# Patient Record
Sex: Female | Born: 1950 | Race: Black or African American | Hispanic: No | Smoking: Never smoker
Health system: Southern US, Community
[De-identification: ages and names within clinical notes are randomized; demographics above are authoritative.]

## PROBLEM LIST (undated history)

## (undated) DIAGNOSIS — E079 Disorder of thyroid, unspecified: Secondary | ICD-10-CM

## (undated) DIAGNOSIS — E785 Hyperlipidemia, unspecified: Secondary | ICD-10-CM

## (undated) DIAGNOSIS — I8390 Asymptomatic varicose veins of unspecified lower extremity: Secondary | ICD-10-CM

## (undated) DIAGNOSIS — D72819 Decreased white blood cell count, unspecified: Principal | ICD-10-CM

## (undated) DIAGNOSIS — I10 Essential (primary) hypertension: Secondary | ICD-10-CM

## (undated) HISTORY — DX: Asymptomatic varicose veins of unspecified lower extremity: I83.90

## (undated) HISTORY — DX: Decreased white blood cell count, unspecified: D72.819

## (undated) HISTORY — PX: JOINT REPLACEMENT: SHX530

## (undated) HISTORY — PX: ROTATOR CUFF REPAIR: SHX139

---

## 1998-05-18 ENCOUNTER — Other Ambulatory Visit: Admission: RE | Admit: 1998-05-18 | Discharge: 1998-05-18 | Payer: Self-pay | Admitting: Internal Medicine

## 1998-06-29 ENCOUNTER — Ambulatory Visit (HOSPITAL_COMMUNITY): Admission: RE | Admit: 1998-06-29 | Discharge: 1998-06-29 | Payer: Self-pay | Admitting: Internal Medicine

## 1998-06-29 ENCOUNTER — Encounter: Payer: Self-pay | Admitting: Internal Medicine

## 1999-07-10 ENCOUNTER — Other Ambulatory Visit: Admission: RE | Admit: 1999-07-10 | Discharge: 1999-07-10 | Payer: Self-pay | Admitting: Internal Medicine

## 1999-08-14 ENCOUNTER — Emergency Department (HOSPITAL_COMMUNITY): Admission: EM | Admit: 1999-08-14 | Discharge: 1999-08-14 | Payer: Self-pay

## 2000-06-03 ENCOUNTER — Other Ambulatory Visit: Admission: RE | Admit: 2000-06-03 | Discharge: 2000-06-03 | Payer: Self-pay | Admitting: Internal Medicine

## 2001-06-24 ENCOUNTER — Other Ambulatory Visit: Admission: RE | Admit: 2001-06-24 | Discharge: 2001-06-24 | Payer: Self-pay | Admitting: Internal Medicine

## 2001-10-20 ENCOUNTER — Ambulatory Visit (HOSPITAL_COMMUNITY): Admission: RE | Admit: 2001-10-20 | Discharge: 2001-10-20 | Payer: Self-pay | Admitting: Internal Medicine

## 2001-10-20 ENCOUNTER — Encounter: Payer: Self-pay | Admitting: Internal Medicine

## 2001-12-10 ENCOUNTER — Encounter: Payer: Self-pay | Admitting: Emergency Medicine

## 2001-12-10 ENCOUNTER — Emergency Department (HOSPITAL_COMMUNITY): Admission: EM | Admit: 2001-12-10 | Discharge: 2001-12-10 | Payer: Self-pay | Admitting: Emergency Medicine

## 2002-01-22 ENCOUNTER — Encounter: Payer: Self-pay | Admitting: Endocrinology

## 2002-01-22 ENCOUNTER — Ambulatory Visit (HOSPITAL_COMMUNITY): Admission: RE | Admit: 2002-01-22 | Discharge: 2002-01-22 | Payer: Self-pay | Admitting: Endocrinology

## 2002-02-17 ENCOUNTER — Ambulatory Visit (HOSPITAL_COMMUNITY): Admission: RE | Admit: 2002-02-17 | Discharge: 2002-02-17 | Payer: Self-pay | Admitting: Endocrinology

## 2002-02-17 ENCOUNTER — Encounter: Payer: Self-pay | Admitting: Endocrinology

## 2002-10-22 ENCOUNTER — Encounter: Payer: Self-pay | Admitting: Obstetrics & Gynecology

## 2002-10-22 ENCOUNTER — Ambulatory Visit (HOSPITAL_COMMUNITY): Admission: RE | Admit: 2002-10-22 | Discharge: 2002-10-22 | Payer: Self-pay | Admitting: Obstetrics & Gynecology

## 2003-11-10 ENCOUNTER — Ambulatory Visit (HOSPITAL_COMMUNITY): Admission: RE | Admit: 2003-11-10 | Discharge: 2003-11-10 | Payer: Self-pay | Admitting: Internal Medicine

## 2003-12-17 ENCOUNTER — Emergency Department (HOSPITAL_COMMUNITY): Admission: EM | Admit: 2003-12-17 | Discharge: 2003-12-17 | Payer: Self-pay | Admitting: Emergency Medicine

## 2004-01-11 ENCOUNTER — Encounter: Admission: RE | Admit: 2004-01-11 | Discharge: 2004-02-04 | Payer: Self-pay | Admitting: Internal Medicine

## 2004-04-17 ENCOUNTER — Ambulatory Visit (HOSPITAL_COMMUNITY): Admission: RE | Admit: 2004-04-17 | Discharge: 2004-04-17 | Payer: Self-pay | Admitting: Gastroenterology

## 2004-09-29 ENCOUNTER — Emergency Department (HOSPITAL_COMMUNITY): Admission: EM | Admit: 2004-09-29 | Discharge: 2004-09-29 | Payer: Self-pay | Admitting: Emergency Medicine

## 2004-10-05 ENCOUNTER — Encounter: Admission: RE | Admit: 2004-10-05 | Discharge: 2004-10-05 | Payer: Self-pay | Admitting: Internal Medicine

## 2005-04-25 ENCOUNTER — Emergency Department (HOSPITAL_COMMUNITY): Admission: EM | Admit: 2005-04-25 | Discharge: 2005-04-25 | Payer: Self-pay | Admitting: Emergency Medicine

## 2006-04-25 ENCOUNTER — Ambulatory Visit (HOSPITAL_COMMUNITY): Admission: RE | Admit: 2006-04-25 | Discharge: 2006-04-25 | Payer: Self-pay | Admitting: Gastroenterology

## 2006-05-02 ENCOUNTER — Encounter: Admission: RE | Admit: 2006-05-02 | Discharge: 2006-05-02 | Payer: Self-pay | Admitting: Gastroenterology

## 2007-08-07 ENCOUNTER — Emergency Department (HOSPITAL_COMMUNITY): Admission: EM | Admit: 2007-08-07 | Discharge: 2007-08-07 | Payer: Self-pay | Admitting: Emergency Medicine

## 2008-02-11 ENCOUNTER — Encounter: Admission: RE | Admit: 2008-02-11 | Discharge: 2008-02-11 | Payer: Self-pay | Admitting: Internal Medicine

## 2008-04-02 ENCOUNTER — Emergency Department (HOSPITAL_COMMUNITY): Admission: EM | Admit: 2008-04-02 | Discharge: 2008-04-03 | Payer: Self-pay | Admitting: Emergency Medicine

## 2008-11-01 ENCOUNTER — Ambulatory Visit: Payer: Self-pay | Admitting: Internal Medicine

## 2009-01-05 ENCOUNTER — Emergency Department (HOSPITAL_COMMUNITY): Admission: EM | Admit: 2009-01-05 | Discharge: 2009-01-05 | Payer: Self-pay | Admitting: Emergency Medicine

## 2009-01-25 ENCOUNTER — Ambulatory Visit (HOSPITAL_COMMUNITY): Admission: RE | Admit: 2009-01-25 | Discharge: 2009-01-25 | Payer: Self-pay | Admitting: Cardiology

## 2009-06-07 ENCOUNTER — Encounter: Admission: RE | Admit: 2009-06-07 | Discharge: 2009-06-07 | Payer: Self-pay | Admitting: Family Medicine

## 2010-03-10 ENCOUNTER — Encounter: Admission: RE | Admit: 2010-03-10 | Discharge: 2010-03-10 | Payer: Self-pay | Admitting: Family Medicine

## 2010-05-07 ENCOUNTER — Emergency Department (HOSPITAL_COMMUNITY)
Admission: EM | Admit: 2010-05-07 | Discharge: 2010-05-07 | Payer: Self-pay | Source: Home / Self Care | Admitting: Emergency Medicine

## 2010-06-11 ENCOUNTER — Encounter: Payer: Self-pay | Admitting: Internal Medicine

## 2010-07-31 LAB — COMPREHENSIVE METABOLIC PANEL
AST: 25 U/L (ref 0–37)
Albumin: 3.7 g/dL (ref 3.5–5.2)
Alkaline Phosphatase: 78 U/L (ref 39–117)
BUN: 16 mg/dL (ref 6–23)
Calcium: 9.3 mg/dL (ref 8.4–10.5)
Creatinine, Ser: 1.19 mg/dL (ref 0.4–1.2)
GFR calc Af Amer: 57 mL/min — ABNORMAL LOW (ref >60)
GFR calc non Af Amer: 47 mL/min — ABNORMAL LOW (ref >60)
Glucose, Bld: 96 mg/dL (ref 70–99)
Potassium: 3.7 mEq/L (ref 3.5–5.1)
Total Bilirubin: 0.4 mg/dL (ref 0.3–1.2)
Total Protein: 7.5 g/dL (ref 6.0–8.3)

## 2010-07-31 LAB — CBC
HCT: 38.2 % (ref 36.0–46.0)
Hemoglobin: 12 g/dL (ref 12.0–15.0)
MCH: 27.8 pg (ref 26.0–34.0)
MCHC: 31.4 g/dL (ref 30.0–36.0)
MCV: 88.6 fL (ref 78.0–100.0)
RBC: 4.31 MIL/uL (ref 3.87–5.11)
WBC: 4.8 10*3/uL (ref 4.0–10.5)

## 2010-07-31 LAB — DIFFERENTIAL
Eosinophils Absolute: 0 10*3/uL (ref 0.0–0.7)
Lymphocytes Relative: 40 % (ref 12–46)
Monocytes Relative: 6 % (ref 3–12)
Neutro Abs: 2.6 10*3/uL (ref 1.7–7.7)

## 2010-07-31 LAB — URINALYSIS, ROUTINE W REFLEX MICROSCOPIC
Hgb urine dipstick: NEGATIVE
pH: 5.5 (ref 5.0–8.0)

## 2010-08-26 LAB — CBC
HCT: 33.5 % — ABNORMAL LOW (ref 36.0–46.0)
Hemoglobin: 11.1 g/dL — ABNORMAL LOW (ref 12.0–15.0)
Platelets: 223 10*3/uL (ref 150–400)
RBC: 4.04 MIL/uL (ref 3.87–5.11)
WBC: 4.6 10*3/uL (ref 4.0–10.5)

## 2010-08-26 LAB — POCT I-STAT, CHEM 8
Chloride: 106 mEq/L (ref 96–112)
Glucose, Bld: 91 mg/dL (ref 70–99)
Hemoglobin: 11.6 g/dL — ABNORMAL LOW (ref 12.0–15.0)
Sodium: 140 mEq/L (ref 135–145)
TCO2: 25 mmol/L (ref 0–100)

## 2010-08-26 LAB — DIFFERENTIAL
Basophils Relative: 0 % (ref 0–1)
Eosinophils Absolute: 0 10*3/uL (ref 0.0–0.7)
Eosinophils Relative: 1 % (ref 0–5)
Lymphocytes Relative: 37 % (ref 12–46)
Lymphs Abs: 1.7 10*3/uL (ref 0.7–4.0)
Monocytes Absolute: 0.4 10*3/uL (ref 0.1–1.0)
Neutrophils Relative %: 54 % (ref 43–77)

## 2010-08-26 LAB — POCT CARDIAC MARKERS
CKMB, poc: <1 ng/mL — ABNORMAL LOW (ref 1.0–8.0)
Myoglobin, poc: 80.5 ng/mL (ref 12–200)
Troponin i, poc: <0.05 ng/mL (ref 0.00–0.09)

## 2010-10-06 NOTE — Op Note (Signed)
Beth Potts, Beth Potts              ACCOUNT NO.:  [id number]   MEDICAL RECORD NO.:  [id number]          PATIENT TYPE:  AMB   LOCATION:  ENDO                         FACILITY:  [facility]   PHYSICIAN:  Anselmo Rod, M.D.  DATE OF BIRTH:  10-26-1950   DATE OF PROCEDURE:  04/17/2004  DATE OF DISCHARGE:                                 OPERATIVE REPORT   PROCEDURE PERFORMED:  Screening colonoscopy.   ENDOSCOPIST:  Anselmo Rod, M.D.   INSTRUMENT USED:  Olympus video colonoscope.   INDICATION FOR PROCEDURE:  A 60 year old African-American female undergoing  screening colonoscopy to rule out colonic polyps, masses, etc.   PREPROCEDURE PREPARATION:  Informed consent was procured from the patient.  The patient was fasted for eight hours prior to the procedure and prepped  with a bottle of magnesium citrate and a gallon of GoLYTELY the night prior  to the procedure.  A 10% miss rate of polyps and cancers was discussed with  the patient as well in addition to other risks and benefits of the  procedure.   PREPROCEDURE PHYSICAL:  VITAL SIGNS:  The patient had stable vital signs.  NECK:  Supple.  CHEST:  Clear to auscultation.  S1, S2 regular.  ABDOMEN:  Soft with normal bowel sounds.   DESCRIPTION OF PROCEDURE:  The patient was placed in the left lateral  decubitus position and sedated with 60 mg of Demerol and 7 mg of Versed in  slow incremental doses.  Once the patient was adequately sedate and  maintained on low-flow oxygen and continuous cardiac monitoring, the Olympus  video colonoscope was advanced from the rectum to the cecum.  The  appendiceal orifice and the ileocecal valve were visualized and  photographed.  There was a large amount of residual stool in the colon,  especially on the right side.  Multiple washes were done.  The terminal  ileum appeared healthy and without lesions.  No masses, polyps, erosions,  ulcerations, or diverticula were seen.  Retroflexion in the rectum  revealed  no abnormalities.  As there was a large amount of residual stool in the  colon, small lesions could have been missed.  The patient tolerated the  procedure well without complications.   IMPRESSION:  1.  Inadequate prep with a large amount of residual stool in the colon.  No      masses, polyps, or diverticula seen.  2.  Normal terminal ileum.   RECOMMENDATIONS:  1.  A repeat colonoscopy has been recommended in the next five years or      earlier if the patient develops any abnormal symptoms in the interim.  2.  Continue a high-fiber diet with liberal fluid intake.  3.  Repeat guaiac testing should be done on a yearly basis as prep was      inadequate this time.  4.  Outpatient follow-up as need arises in the future.       JNM/MEDQ  D:  04/17/2004  T:  04/17/2004  Job:  [id number]   cc:   Merlene Laughter. Renae Gloss, M.D.  [address]  [address]  [place]  [place] [address]  Fax: [phone]

## 2012-08-21 ENCOUNTER — Other Ambulatory Visit: Payer: Self-pay

## 2012-08-21 DIAGNOSIS — Z1231 Encounter for screening mammogram for malignant neoplasm of breast: Secondary | ICD-10-CM

## 2012-08-22 ENCOUNTER — Other Ambulatory Visit: Payer: Self-pay | Admitting: Internal Medicine

## 2012-08-22 ENCOUNTER — Ambulatory Visit
Admission: RE | Admit: 2012-08-22 | Discharge: 2012-08-22 | Disposition: A | Discharge status: Home / Self Care | Source: Ambulatory Visit | Attending: Internal Medicine | Admitting: Internal Medicine

## 2012-08-22 DIAGNOSIS — Z111 Encounter for screening for respiratory tuberculosis: Secondary | ICD-10-CM

## 2012-09-23 ENCOUNTER — Ambulatory Visit
Admission: RE | Admit: 2012-09-23 | Discharge: 2012-09-23 | Disposition: A | Discharge status: Home / Self Care | Source: Ambulatory Visit

## 2012-09-23 DIAGNOSIS — Z1231 Encounter for screening mammogram for malignant neoplasm of breast: Secondary | ICD-10-CM

## 2013-01-12 ENCOUNTER — Emergency Department (HOSPITAL_COMMUNITY)
Admission: EM | Admit: 2013-01-12 | Discharge: 2013-01-12 | Disposition: A | Discharge status: Home / Self Care | Attending: Emergency Medicine | Admitting: Emergency Medicine

## 2013-01-12 ENCOUNTER — Encounter (HOSPITAL_COMMUNITY): Payer: Self-pay | Admitting: Emergency Medicine

## 2013-01-12 DIAGNOSIS — Z862 Personal history of diseases of the blood and blood-forming organs and certain disorders involving the immune mechanism: Secondary | ICD-10-CM | POA: Insufficient documentation

## 2013-01-12 DIAGNOSIS — L539 Erythematous condition, unspecified: Secondary | ICD-10-CM

## 2013-01-12 DIAGNOSIS — R21 Rash and other nonspecific skin eruption: Secondary | ICD-10-CM | POA: Insufficient documentation

## 2013-01-12 DIAGNOSIS — M7989 Other specified soft tissue disorders: Secondary | ICD-10-CM | POA: Insufficient documentation

## 2013-01-12 DIAGNOSIS — L02419 Cutaneous abscess of limb, unspecified: Secondary | ICD-10-CM | POA: Insufficient documentation

## 2013-01-12 DIAGNOSIS — L03116 Cellulitis of left lower limb: Secondary | ICD-10-CM

## 2013-01-12 DIAGNOSIS — Z8639 Personal history of other endocrine, nutritional and metabolic disease: Secondary | ICD-10-CM | POA: Insufficient documentation

## 2013-01-12 DIAGNOSIS — I10 Essential (primary) hypertension: Secondary | ICD-10-CM | POA: Insufficient documentation

## 2013-01-12 HISTORY — DX: Essential (primary) hypertension: I10

## 2013-01-12 HISTORY — DX: Disorder of thyroid, unspecified: E07.9

## 2013-01-12 HISTORY — DX: Hyperlipidemia, unspecified: E78.5

## 2013-01-12 MED ORDER — SULFAMETHOXAZOLE-TRIMETHOPRIM 800-160 MG PO TABS: 1.0000 | ORAL_TABLET | Freq: Two times a day (BID) | ORAL | Status: DC

## 2013-01-12 MED ORDER — HYDROXYZINE HCL 25 MG PO TABS: 25.0000 mg | ORAL_TABLET | Freq: Four times a day (QID) | ORAL | Status: DC

## 2013-01-12 NOTE — ED Notes (Signed)
Pt has a rash on her L ankle that has been there x 3 weeks. Saw her PA and was given penicillin, hydrocodone and a steroid cream but she states it is still hurting and itching. Also notes numbness in both her feet.

## 2013-01-12 NOTE — ED Provider Notes (Signed)
CSN: [id number]     Arrival date & time 01/12/13  1655 History  This chart was scribed for Junius Finner, PA working with Dagmar Hait, MD by Quintella Reichert, ED Scribe. This patient was seen in room WTR9/WTR9 and the patient's care was started at 5:36 PM.    Chief Complaint  Patient presents with  . Rash  . Numbness    The history is provided by the patient. No language interpreter was used.    HPI Comments: TAQUISHA PHUNG is a 62 y.o. female with h/o HTN, hyperlipidemia and hyperthyroidism who presents to the Emergency Department complaining of a progressively-worsening, painful, red, itchy rash on her left ankle that began 3 weeks ago with associated swelling to her left lower leg.  Pain is described as intermittent burning and throbbing at a severity of 8/10.  Pt also notes numbness to both of her feet.  She has attempted to treat symptoms with OTC hydrocortisone cream, without relief.  More recently she was prescribed penicillin and mometasone cream by her PA, which she has been using for the past 4 days also without relief.  Pt denies any recent long travel.  She denies allergies to her knowledge.  She states she has h/o pre-diabetes but has never been diagnosed with diabetes.  She denies h/o heart problems to her knowledge.  CBG on admission is 78.  She medicates with Synthroid and states that her levels have been normal.     Past Medical History  Diagnosis Date  . Thyroid disease   . Hypertension   . Hyperlipidemia      Past Surgical History  Procedure Laterality Date  . Rotator cuff repair Bilateral   . Joint replacement Right     knee     No family history on file.   History  Substance Use Topics  . Smoking status: Never Smoker   . Smokeless tobacco: Not on file  . Alcohol Use: No    OB History   Grav Para Term Preterm Abortions TAB SAB Ect Mult Living                   Review of Systems  Musculoskeletal:       Lower left leg swelling  Skin:  Positive for rash.  All other systems reviewed and are negative.      Allergies  Review of patient's allergies indicates no known allergies.  Home Medications   Current Outpatient Rx  Name  Route  Sig  Dispense  Refill  . hydrOXYzine (ATARAX/VISTARIL) 25 MG tablet   Oral   Take 1 tablet (25 mg total) by mouth every 6 (six) hours.   12 tablet   0   . sulfamethoxazole-trimethoprim (SEPTRA DS) 800-160 MG per tablet   Oral   Take 1 tablet by mouth every 12 (twelve) hours.   20 tablet   0     Pulse 70  Temp(Src) 98.7 F (37.1 C) (Oral)  SpO2 100%  Physical Exam  Nursing note and vitals reviewed. Constitutional: She is oriented to person, place, and time. She appears well-developed and well-nourished.  HENT:  Head: Normocephalic and atraumatic.  Eyes: EOM are normal.  Neck: Normal range of motion.  Cardiovascular: Normal rate.   Pulmonary/Chest: Effort normal.  Musculoskeletal: Normal range of motion.       Left lower leg: She exhibits edema.  Mild-to-moderate edema to left lower leg.  Neurological: She is alert and oriented to person, place, and time.  Skin: Skin  is warm and dry. Rash noted. There is erythema.  Left lower leg: Erythema with excoriation.  No warmth to palpation.  No pustular abscess.   Prominent varicose veins, greater to left lower leg than right.  Psychiatric: She has a normal mood and affect. Her behavior is normal.    ED Course  Procedures (including critical care time)  DIAGNOSTIC STUDIES: Oxygen Saturation is 100% on room air, normal by my interpretation.    COORDINATION OF CARE: 5:43 PM-Discussed treatment plan which includes evaluation by attending physician with pt at bedside and pt agreed to plan.    Labs Review Labs Reviewed  GLUCOSE, CAPILLARY    Imaging Review No results found.   MDM   1. Cellulitis of left lower leg    Initial concern for DVT as pt had more prominent varicose veins with swelling and pain in left lower  leg, however pruritic rash not typical for DVT.  Venous duplex: negative for DVT or bakers cyst.  Discussed pt with Dr. Gwendolyn Grant who also examined pt.  Will tx for cellulitis with bactrim as pt was initially placed on PCN, which is not first line for skin infections.  Pt also given atarax help with severe pruritis.  Advised she may continue to use hydrocortisone cream for relief as well.  Return precautions provided. Pt verbalized understanding and agreement with tx plan.   I personally performed the services described in this documentation, which was scribed in my presence. The recorded information has been reviewed and is accurate.    Junius Finner, PA-C 01/13/13 1751

## 2013-01-12 NOTE — Progress Notes (Signed)
VASCULAR LAB PRELIMINARY  PRELIMINARY  PRELIMINARY  PRELIMINARY  Left lower extremity venous duplex completed.    Preliminary report:  Left:  No evidence of DVT, superficial thrombosis, or Baker's cyst.  Evangelene Vora, RVT 01/12/2013, 7:23 PM

## 2013-01-12 NOTE — ED Notes (Signed)
MD at bedside. 

## 2013-01-12 NOTE — ED Notes (Signed)
CBG 78. 

## 2013-01-15 NOTE — ED Provider Notes (Signed)
Medical screening examination/treatment/procedure(s) were conducted as a shared visit with non-physician practitioner(s) and myself.  I personally evaluated the patient during the encounter  Patient with 3 weeks of L leg redness, itching. PCP put on Penicillin. Mild swelling of L leg, NVI distally. Mild patchy erythema. [place] negative, will treat with bactrim and give Atarax for itching.   Dagmar Hait, MD 01/15/13 [phone]

## 2013-06-03 ENCOUNTER — Other Ambulatory Visit: Payer: Self-pay | Admitting: Family

## 2013-06-03 ENCOUNTER — Ambulatory Visit
Admission: RE | Admit: 2013-06-03 | Discharge: 2013-06-03 | Disposition: A | Discharge status: Home / Self Care | Source: Ambulatory Visit | Attending: Family | Admitting: Family

## 2013-06-03 DIAGNOSIS — S99929A Unspecified injury of unspecified foot, initial encounter: Secondary | ICD-10-CM

## 2013-06-03 DIAGNOSIS — R200 Anesthesia of skin: Secondary | ICD-10-CM

## 2013-07-22 ENCOUNTER — Ambulatory Visit (INDEPENDENT_AMBULATORY_CARE_PROVIDER_SITE_OTHER)

## 2013-07-22 ENCOUNTER — Encounter: Payer: Self-pay | Admitting: Podiatrist

## 2013-07-22 ENCOUNTER — Ambulatory Visit (INDEPENDENT_AMBULATORY_CARE_PROVIDER_SITE_OTHER): Admitting: Podiatrist

## 2013-07-22 ENCOUNTER — Ambulatory Visit: Payer: Self-pay | Admitting: Podiatrist

## 2013-07-22 VITALS — BP 131/77 | HR 68 | Resp 16 | Ht 67.0 in | Wt 212.0 lb

## 2013-07-22 DIAGNOSIS — L600 Ingrowing nail: Secondary | ICD-10-CM

## 2013-07-22 DIAGNOSIS — M7150 Other bursitis, not elsewhere classified, unspecified site: Secondary | ICD-10-CM

## 2013-07-22 DIAGNOSIS — M216X9 Other acquired deformities of unspecified foot: Secondary | ICD-10-CM

## 2013-07-22 DIAGNOSIS — M79673 Pain in unspecified foot: Secondary | ICD-10-CM

## 2013-07-22 DIAGNOSIS — M79609 Pain in unspecified limb: Secondary | ICD-10-CM

## 2013-07-22 MED ORDER — CEPHALEXIN 500 MG PO CAPS: 500.0000 mg | ORAL_CAPSULE | Freq: Three times a day (TID) | ORAL | Status: DC

## 2013-07-22 NOTE — Progress Notes (Deleted)
bilat ingrowns both borders 1st;  Injection 3,4 is left  Debride callus submet 3/4 left

## 2013-07-22 NOTE — Progress Notes (Signed)
   Subjective:    Patient ID: Beth Potts, female    DOB: 1950-07-12, 63 y.o.   MRN: [id number]  HPI Comments: "I have ingrown toenails and something is going on with the left foot"  Patient c/o painful 1st toes bilateral, both borders, for several months. She usually gets a pedicure and has them trim out. She has been trimming herself but still gets sore. Pressure against is uncomfortable.  Patient also c/o throbbing and numbness plantar forefoot left for about 3 months. The are is callused and sometimes numbness is in her toes. She remembers banging her foot at night. She did see her PCP at that time, they xrayed and did not see fracture. She has been getting the calluses trimmed during pedicures and soaking her feet with no relief. Worsening, especially with walking.  Foot Pain Associated symptoms include numbness.      Review of Systems  Neurological: Positive for numbness.  Hematological: Bruises/bleeds easily.  All other systems reviewed and are negative.       Objective:   Physical Exam GENERAL APPEARANCE: Alert, conversant. Appropriately groomed. No acute distress.  VASCULAR: Pedal pulses palpable at 2/4 DP and PT bilateral.  Capillary refill time is immediate to all digits,  Proximal to distal cooling it warm to warm.  Digital hair growth is present bilateral  NEUROLOGIC: sensation is intact epicritically and protectively to 5.07 monofilament at 5/5 sites bilateral.  Light touch is intact bilateral, vibratory sensation intact bilateral, achilles tendon reflex is intact bilateral.  MUSCULOSKELETAL: Prominent, plantarflexed metatarsals 3 and 4 of the left foot are noted. Pain at the third and fourth interspace is also present. Pes planus foot type is noted.  DERMATOLOGIC: Ingrown toenails of bilateral borders of bilateral great toes noted. No redness, no signs of infection are present. She also has a hyperkeratotic lesion submetatarsal 3/4 of the left foot which is tender  and symptomatic. Intact integument is present sub-lesionally, no sign of ulceration or skin breakdown.   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  Recommended permanent phenol matrixectomy and patient agreed.  bilateral great toe was prepped with alcohol and a 1 to 1 mix of 0.5% marcaine plain and 2% lidocaine plain was administered in a digital block fashion.  The toe was then prepped with betadine solution and exsanguinated.  The offending nail border was then excised and matrix tissue exposed.  Phenol was then applied to the matrix tissue followed by an alcohol wash.  Antibiotic ointment and a dry sterile dressing was applied.  The patient was dispensed instructions for aftercare.  I divided the hyperkeratotic lesion submetatarsal 3/4 without complication. I injected third and fourth interspace with Kenalog and Marcaine mixture as well under sterile technique. I will see her back for recheck. 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

## 2013-07-22 NOTE — Patient Instructions (Signed)

## 2013-07-23 MED ORDER — TRIAMCINOLONE ACETONIDE 10 MG/ML IJ SUSP
10.0000 mg | Freq: Once | INTRAMUSCULAR | Status: AC
  Administered 2013-07-22: 10 mg

## 2013-07-29 ENCOUNTER — Telehealth: Payer: Self-pay | Admitting: *Deleted

## 2013-07-29 NOTE — Telephone Encounter (Signed)
Pt states toe is still numb from procedure last week and what can she soak it in to decrease the tenderness.  I informed pt, the numbness would gradually decrease, and she could soak in 1/4 C epsom salt and 1 qt warm water 2 time daily, then cover with an antibiotic ointment bandaid for 4 - 6 weeks or until the area got a dry hard scab.  I told the pt she could allow the area to air dry when at home resting.

## 2013-08-05 ENCOUNTER — Ambulatory Visit: Admitting: Podiatrist

## 2013-10-28 ENCOUNTER — Encounter: Payer: Self-pay | Admitting: Podiatrist

## 2013-10-28 ENCOUNTER — Ambulatory Visit (INDEPENDENT_AMBULATORY_CARE_PROVIDER_SITE_OTHER): Admitting: Podiatrist

## 2013-10-28 VITALS — BP 153/79 | HR 70 | Resp 12

## 2013-10-28 DIAGNOSIS — D219 Benign neoplasm of connective and other soft tissue, unspecified: Secondary | ICD-10-CM

## 2013-10-28 DIAGNOSIS — D3610 Benign neoplasm of peripheral nerves and autonomic nervous system, unspecified: Secondary | ICD-10-CM

## 2013-10-28 DIAGNOSIS — M216X9 Other acquired deformities of unspecified foot: Secondary | ICD-10-CM

## 2013-10-28 DIAGNOSIS — Q828 Other specified congenital malformations of skin: Secondary | ICD-10-CM

## 2013-10-28 NOTE — Patient Instructions (Signed)
[facility]  The back doctors-- we will call you with the date and phone number for your appointment.  Call if you have any problems with the referral

## 2013-10-29 NOTE — Progress Notes (Signed)
Patient presents for left foot pain follow up- she relates the injection administered last visit was not helpful and she still has pain in the forefoot region of the left foot.  She describes the pain as shooting and it radiates from her hip down to her foot.  She states she has some numbness between the 3rd and 4th toes as well.  She also has a hard lesion submet 3 which is painful as well.  Objective:  Patient is awake, alert, and oriented x 3.  In no acute distress.  Vascular status is intact with palpable pedal pulses at 2/4 DP and PT bilateral and capillary refill time within normal limits. Neurological sensation is also intact bilaterally via Semmes Weinstein monofilament at 5/5 sites. Light touch, vibratory sensation, Achilles tendon reflex is intact. Dermatological exam reveals skin color, turger and texture as normal. No open lesions present.  Musculature intact with dorsiflexion, plantarflexion, inversion, eversion.    Hyperkeratotic lesion submet 3 of the left foot is present.  Core with waxy appearance is present.  Pain at the 3rd interspace and numbness is noted.  Difficult to tell if a neuroma is present versus the lesion, versus generalized nerve discomfort.  Assessment:  Neuroma vs nerve impingement/sciatica,  Prominent metatarsal with porokeratotic lesion  Plan:  Debridement of lesion carried out today.  Recommended injection vs. Referral to a neurosurgeon to see if she is having nerve related back issues contributing to her foot pain.  She would like to have the referral before proceeding with another injection.  I will see her back in 1 month for follow up and for possible alcohol sclerosing therapy for a possible neuroma left foot.

## 2013-11-13 ENCOUNTER — Telehealth: Payer: Self-pay | Admitting: *Deleted

## 2013-11-13 NOTE — Telephone Encounter (Signed)
Referral was made to [facility] for possible Disc Injury, Back pain extending to foot left.   [facility] [address]  [place], [place] [address] Ph: [phone]

## 2013-11-25 ENCOUNTER — Ambulatory Visit (INDEPENDENT_AMBULATORY_CARE_PROVIDER_SITE_OTHER): Admitting: Podiatrist

## 2013-11-25 ENCOUNTER — Encounter: Payer: Self-pay | Admitting: Podiatrist

## 2013-11-25 VITALS — BP 123/69 | HR 73 | Resp 18

## 2013-11-25 DIAGNOSIS — D219 Benign neoplasm of connective and other soft tissue, unspecified: Secondary | ICD-10-CM

## 2013-11-25 DIAGNOSIS — S90222A Contusion of left lesser toe(s) with damage to nail, initial encounter: Secondary | ICD-10-CM

## 2013-11-25 DIAGNOSIS — D3610 Benign neoplasm of peripheral nerves and autonomic nervous system, unspecified: Secondary | ICD-10-CM

## 2013-11-25 DIAGNOSIS — M722 Plantar fascial fibromatosis: Secondary | ICD-10-CM

## 2013-11-25 DIAGNOSIS — S90129A Contusion of unspecified lesser toe(s) without damage to nail, initial encounter: Secondary | ICD-10-CM

## 2013-11-25 MED ORDER — MELOXICAM 15 MG PO TABS: 15.0000 mg | ORAL_TABLET | Freq: Every day | ORAL | Status: AC

## 2013-11-25 NOTE — Progress Notes (Signed)
I HAVE SOME NUMBNESS IN MY TOES ON MY LEFT AND THE BIG TOENAIL THAT SHE DID ON MY LEFT BIG TOENAIL IS ABOUT TO COME OFF AND THE HEEL HURTS SOME  Subjective: Patient presents today for continued numbness in her toes and slight loosening of the left great toenail. She also states she has heel pain in the left heel. She has an appointment to see a neurosurgeon for her back as I am concerned that it may be a back issue causing her foot pain. We are waiting to do an injection until we real that out.  Objective: Pain on palpation plantar medial aspect of the left heel consistent with plantar fasciitis is noted. Consistent pain at the metatarsal region is noted as well. Left hallux nail is slightly loose but intact she has a wedding to go to [place] she wants to leave that alone  Assessment: Plantar fasciitis, probable neuroma, neuritis left foot, loose hallux nail  Plan: Recommended injection and the patient would like to hold off prescribed MOBIC anti-inflammatory, dispense a plantar fascial brace, recommended stretching exercises. We will followup regarding the numbness in her foot after she sees her neurosurgeon

## 2013-11-25 NOTE — Patient Instructions (Signed)

## 2014-04-23 ENCOUNTER — Ambulatory Visit: Admitting: Podiatrist

## 2014-04-28 ENCOUNTER — Ambulatory Visit (INDEPENDENT_AMBULATORY_CARE_PROVIDER_SITE_OTHER): Admitting: Podiatrist

## 2014-04-28 ENCOUNTER — Encounter: Payer: Self-pay | Admitting: Podiatrist

## 2014-04-28 VITALS — BP 117/70 | HR 70 | Resp 12

## 2014-04-28 DIAGNOSIS — G629 Polyneuropathy, unspecified: Secondary | ICD-10-CM

## 2014-04-28 DIAGNOSIS — Q828 Other specified congenital malformations of skin: Secondary | ICD-10-CM

## 2014-04-28 MED ORDER — AMITRIPTYLINE HCL 25 MG PO TABS: 25.0000 mg | ORAL_TABLET | Freq: Every day | ORAL | Status: AC

## 2014-04-28 MED ORDER — SALICYLIC ACID 10 % EX CREA: 1.0000 "application " | TOPICAL_CREAM | Freq: Every day | CUTANEOUS | Status: DC

## 2014-04-28 NOTE — Patient Instructions (Signed)
Corns and Calluses Corns are small areas of thickened skin that usually occur on the top, sides, or tip of a toe. They contain a cone-shaped core with a point that can press on a nerve below. This causes pain. Calluses are areas of thickened skin that usually develop on hands, fingers, palms, soles of the feet, and heels. These are areas that experience frequent friction or pressure. CAUSES  Corns are usually the result of rubbing (friction) or pressure from shoes that are too tight or do not fit properly. Calluses are caused by repeated friction and pressure on the affected areas. SYMPTOMS  A hard growth on the skin.  Pain or tenderness under the skin.  Sometimes, redness and swelling.  Increased discomfort while wearing tight-fitting shoes. DIAGNOSIS  Your caregiver can usually tell what the problem is by doing a physical exam. TREATMENT  Removing the cause of the friction or pressure is usually the only treatment needed. However, sometimes medicines can be used to help soften the hardened, thickened areas. These medicines include salicylic acid plasters and 12% ammonium lactate lotion. These medicines should only be used under the direction of your caregiver. HOME CARE INSTRUCTIONS   Try to remove pressure from the affected area.  You may wear donut-shaped corn pads to protect your skin.  You may use a pumice stone or nonmetallic nail file to gently reduce the thickness of a corn.  Wear properly fitted footwear.  If you have calluses on the hands, wear gloves during activities that cause friction.  If you have diabetes, you should regularly examine your feet. Tell your caregiver if you notice any problems with your feet. SEEK IMMEDIATE MEDICAL CARE IF:   You have increased pain, swelling, redness, or warmth in the affected area.  Your corn or callus starts to drain fluid or bleeds.  You are not getting better, even with treatment. Document Released: 02/11/2004 Document  Revised: 07/30/2011 Document Reviewed: 01/02/2011 Exit[employer] Patient Information 2015 [employer]. This information is not intended to replace advice given to you by your health care provider. Make sure you discuss any questions you have with your health care provider.  

## 2014-04-28 NOTE — Progress Notes (Signed)
  Subjective: Patient presents today for continued numbness in her toes and pain in the forefoot region of bilateral feet.  She has a skin lesion that is painful and symptomatic.  She saw a  neurosurgeon for her back and he stated her foot pain is unrelated to back issues.   Objective: Pain on palpation plantar forefoot bilateral.  A well circumscribed lesion is present submet 3 bilateral which is painful with direct pressure and debridement.  Heel pain is not bothersome at today's visit. neuropathic symptomatology is subjectively related. Sensation is intact via Semmes-Weinstein monofilament at 4 out of 5 sites.  Assessment: Prominent metatarsal heads, porokeratotic lesion, neuropathy   Plan: Debridement of the porokeratotic lesion is accomplished today without complication. Recommended amitriptyline at night and 80% salicylic acid cream for the lesions. She'll be seen back for recheck and follow-up of the amitriptyline.Marland KitchenMarland Kitchen

## 2014-05-12 ENCOUNTER — Other Ambulatory Visit: Payer: Self-pay | Admitting: *Deleted

## 2014-05-12 MED ORDER — SALICYLIC ACID 10 % EX CREA: 1.0000 "application " | TOPICAL_CREAM | Freq: Every day | CUTANEOUS | Status: DC

## 2014-05-12 NOTE — Telephone Encounter (Signed)
[facility] sent over a request to change Salicylic Acid from 22% to 6%, don't have it in stock.  Dr. Marta Antu the change.

## 2014-06-02 ENCOUNTER — Other Ambulatory Visit: Payer: Self-pay | Admitting: *Deleted

## 2014-06-02 NOTE — Telephone Encounter (Signed)
[facility] sent over a request asking if prescription could be changed to 6% instead of 10%.  Dr. Cala Bradford the change.

## 2014-10-15 NOTE — Telephone Encounter (Signed)
PT HAS AN APPOINTMENT ON 12/10/2014 AT 4:45 WITH DR. HARKINS

## 2014-10-29 ENCOUNTER — Other Ambulatory Visit: Payer: Self-pay

## 2014-10-29 DIAGNOSIS — Z1231 Encounter for screening mammogram for malignant neoplasm of breast: Secondary | ICD-10-CM

## 2014-11-03 ENCOUNTER — Ambulatory Visit
Admission: RE | Admit: 2014-11-03 | Discharge: 2014-11-03 | Disposition: A | Discharge status: Home / Self Care | Source: Ambulatory Visit

## 2014-11-03 DIAGNOSIS — Z1231 Encounter for screening mammogram for malignant neoplasm of breast: Secondary | ICD-10-CM

## 2014-11-14 ENCOUNTER — Telehealth: Payer: Self-pay | Admitting: *Deleted

## 2014-12-09 ENCOUNTER — Emergency Department (HOSPITAL_COMMUNITY)
Admission: EM | Admit: 2014-12-09 | Discharge: 2014-12-09 | Disposition: A | Discharge status: Home / Self Care | Attending: Emergency Medicine | Admitting: Emergency Medicine

## 2014-12-09 ENCOUNTER — Encounter (HOSPITAL_COMMUNITY): Payer: Self-pay | Admitting: Emergency Medicine

## 2014-12-09 DIAGNOSIS — E079 Disorder of thyroid, unspecified: Secondary | ICD-10-CM | POA: Insufficient documentation

## 2014-12-09 DIAGNOSIS — R519 Headache, unspecified: Secondary | ICD-10-CM

## 2014-12-09 DIAGNOSIS — E785 Hyperlipidemia, unspecified: Secondary | ICD-10-CM | POA: Diagnosis not present

## 2014-12-09 DIAGNOSIS — Z791 Long term (current) use of non-steroidal anti-inflammatories (NSAID): Secondary | ICD-10-CM | POA: Insufficient documentation

## 2014-12-09 DIAGNOSIS — M791 Myalgia, unspecified site: Secondary | ICD-10-CM

## 2014-12-09 DIAGNOSIS — R5383 Other fatigue: Secondary | ICD-10-CM | POA: Diagnosis not present

## 2014-12-09 DIAGNOSIS — R252 Cramp and spasm: Secondary | ICD-10-CM | POA: Insufficient documentation

## 2014-12-09 DIAGNOSIS — R51 Headache: Secondary | ICD-10-CM | POA: Diagnosis not present

## 2014-12-09 DIAGNOSIS — Z79899 Other long term (current) drug therapy: Secondary | ICD-10-CM | POA: Insufficient documentation

## 2014-12-09 DIAGNOSIS — Z792 Long term (current) use of antibiotics: Secondary | ICD-10-CM | POA: Diagnosis not present

## 2014-12-09 DIAGNOSIS — I10 Essential (primary) hypertension: Secondary | ICD-10-CM | POA: Diagnosis not present

## 2014-12-09 LAB — CBC WITH DIFFERENTIAL/PLATELET
BASOS ABS: 0 10*3/uL (ref 0.0–0.1)
Basophils Relative: 0 % (ref 0–1)
EOS ABS: 0 10*3/uL (ref 0.0–0.7)
Eosinophils Relative: 1 % (ref 0–5)
HEMATOCRIT: 40.1 % (ref 36.0–46.0)
Hemoglobin: 12.5 g/dL (ref 12.0–15.0)
LYMPHS ABS: 2 10*3/uL (ref 0.7–4.0)
LYMPHS PCT: 44 % (ref 12–46)
MCH: 26.3 pg (ref 26.0–34.0)
MCHC: 31.2 g/dL (ref 30.0–36.0)
MCV: 84.4 fL (ref 78.0–100.0)
MONO ABS: 0.3 10*3/uL (ref 0.1–1.0)
Monocytes Relative: 7 % (ref 3–12)
NEUTROS ABS: 2.2 10*3/uL (ref 1.7–7.7)
NEUTROS PCT: 48 % (ref 43–77)
PLATELETS: 202 10*3/uL (ref 150–400)
RBC: 4.75 MIL/uL (ref 3.87–5.11)
RDW: 14.4 % (ref 11.5–15.5)
WBC: 4.6 10*3/uL (ref 4.0–10.5)

## 2014-12-09 LAB — BASIC METABOLIC PANEL
Anion gap: 8 (ref 5–15)
BUN: 29 mg/dL — ABNORMAL HIGH (ref 6–20)
CALCIUM: 9.3 mg/dL (ref 8.9–10.3)
CO2: 26 mmol/L (ref 22–32)
Chloride: 103 mmol/L (ref 101–111)
Creatinine, Ser: 1.16 mg/dL — ABNORMAL HIGH (ref 0.44–1.00)
GFR calc Af Amer: 57 mL/min — ABNORMAL LOW (ref >60)
GFR, EST NON AFRICAN AMERICAN: 49 mL/min — AB (ref >60)
Glucose, Bld: 95 mg/dL (ref 65–99)
POTASSIUM: 4.4 mmol/L (ref 3.5–5.1)
Sodium: 137 mmol/L (ref 135–145)

## 2014-12-09 LAB — URINALYSIS, ROUTINE W REFLEX MICROSCOPIC
BILIRUBIN URINE: NEGATIVE
Glucose, UA: NEGATIVE mg/dL
HGB URINE DIPSTICK: NEGATIVE
Ketones, ur: NEGATIVE mg/dL
LEUKOCYTES UA: NEGATIVE
NITRITE: NEGATIVE
PH: 7 (ref 5.0–8.0)
Protein, ur: NEGATIVE mg/dL
SPECIFIC GRAVITY, URINE: 1.018 (ref 1.005–1.030)
Urobilinogen, UA: 0.2 mg/dL (ref 0.0–1.0)

## 2014-12-09 MED ORDER — HYDROMORPHONE HCL 1 MG/ML IJ SOLN
0.5000 mg | Freq: Once | INTRAMUSCULAR | Status: AC
  Administered 2014-12-09: 0.5 mg via INTRAVENOUS
  Filled 2014-12-09: qty 1

## 2014-12-09 MED ORDER — SODIUM CHLORIDE 0.9 % IV BOLUS (SEPSIS)
1000.0000 mL | Freq: Once | INTRAVENOUS | Status: AC
  Administered 2014-12-09: 1000 mL via INTRAVENOUS

## 2014-12-09 NOTE — Discharge Instructions (Signed)

## 2014-12-09 NOTE — ED Notes (Signed)
Pt from home c/o bilateral leg cramping and headaches. She reports visiting her PCP and her thyroid and vitamin D were low. Medications were adjusted. Pain x 2 weeks.

## 2014-12-09 NOTE — ED Provider Notes (Signed)
CSN: [id number]     Arrival date & time 12/09/14  0027 History   First MD Initiated Contact with Patient 12/09/14 [phone]     Chief Complaint  Patient presents with  . leg cramps    . Headache     (Consider location/radiation/quality/duration/timing/severity/associated sxs/prior Treatment) Patient is a 64 y.o. female presenting with general illness.  Illness Location:  Generalized Quality:  Aching, fatigue Severity:  Moderate Onset quality:  Gradual Duration:  2 weeks Timing:  Constant Progression:  Worsening Chronicity:  New Context:  Seen by PCP with unremarkable wu Relieved by:  Nothing Worsened by:  Nothing Associated symptoms: fatigue and myalgias   Associated symptoms: no chest pain, no cough, no diarrhea, no fever, no loss of consciousness, no nausea and no vomiting     Past Medical History  Diagnosis Date  . Thyroid disease   . Hypertension   . Hyperlipidemia    Past Surgical History  Procedure Laterality Date  . Rotator cuff repair Bilateral   . Joint replacement Right     knee   No family history on file. History  Substance Use Topics  . Smoking status: Never Smoker   . Smokeless tobacco: Not on file  . Alcohol Use: No   OB History    No data available     Review of Systems  Constitutional: Positive for fatigue. Negative for fever.  Respiratory: Negative for cough.   Cardiovascular: Negative for chest pain.  Gastrointestinal: Negative for nausea, vomiting and diarrhea.  Musculoskeletal: Positive for myalgias.  Neurological: Negative for loss of consciousness.  All other systems reviewed and are negative.     Allergies  Aspirin  Home Medications   Prior to Admission medications   Medication Sig Start Date End Date Taking? Authorizing Provider  amitriptyline (ELAVIL) 25 MG tablet Take 1 tablet (25 mg total) by mouth at bedtime. 04/28/14   Bronson Ing, DPM  cephALEXin (KEFLEX) 500 MG capsule Take 1 capsule (500 mg total) by mouth 3  (three) times daily. 07/22/13   Bronson Ing, DPM  Dexlansoprazole (DEXILANT PO) Take by mouth.    Historical Provider, MD  dexlansoprazole (DEXILANT) 60 MG capsule Take 60 mg by mouth.    Historical Provider, MD  Levothyroxine Sodium 137 MCG CAPS Take by mouth daily before breakfast.    Historical Provider, MD  meloxicam (MOBIC) 15 MG tablet Take 1 tablet (15 mg total) by mouth daily. 11/25/13   Bronson Ing, DPM  montelukast (SINGULAIR) 10 MG tablet Take 10 mg by mouth.    Historical Provider, MD  olmesartan (BENICAR) 20 MG tablet Take 10 mg by mouth.    Historical Provider, MD  Salicylic Acid 10 % CREA Apply 1 application topically daily. 05/12/14   Bronson Ing, DPM  simvastatin (ZOCOR) 10 MG tablet Take 10 mg by mouth.    Historical Provider, MD  simvastatin (ZOCOR) 40 MG tablet Take 40 mg by mouth daily.    Historical Provider, MD   BP 141/84 mmHg  Pulse 62  Temp(Src) 97.9 F (36.6 C) (Oral)  Resp 19  SpO2 99% Physical Exam  Constitutional: She is oriented to person, place, and time. She appears well-developed and well-nourished.  HENT:  Head: Normocephalic and atraumatic.  Right Ear: External ear normal.  Left Ear: External ear normal.  Eyes: Conjunctivae and EOM are normal. Pupils are equal, round, and reactive to light.  Neck: Normal range of motion. Neck supple.  Cardiovascular: Normal rate, regular rhythm, normal heart sounds  and intact distal pulses.   Pulmonary/Chest: Effort normal and breath sounds normal.  Abdominal: Soft. Bowel sounds are normal. There is no tenderness.  Musculoskeletal: Normal range of motion.  Neurological: She is alert and oriented to person, place, and time. She has normal strength and normal reflexes. No cranial nerve deficit or sensory deficit.  Skin: Skin is warm and dry.  Vitals reviewed.   ED Course  Procedures (including critical care time) Labs Review Labs Reviewed  BASIC METABOLIC PANEL - Abnormal; Notable for the  following:    BUN 29 (*)    Creatinine, Ser 1.16 (*)    GFR calc non Af Amer 49 (*)    GFR calc Af Amer 57 (*)    All other components within normal limits  CBC WITH DIFFERENTIAL/PLATELET  URINALYSIS, ROUTINE W REFLEX MICROSCOPIC (NOT AT [facility])    Imaging Review No results found.   EKG Interpretation None      MDM   Final diagnoses:  Myalgia  Acute nonintractable headache, unspecified headache type    64 y.o. female with pertinent PMH of HTN, HLD, hypothyroidism presents with myalgias, fatigue, generalized cramping as above.  Pt endorses mild ha with symptoms, however no fevers, no meningitic signs.  Exam benign.  Wu unremarkable.  Doubt emergent pathology such as renal failure, thyroid storm, ACS, PE, CVA, or other emergent pathology.  Informed to represent if symptoms worsen.    I have reviewed all laboratory and imaging studies if ordered as above  1. Myalgia   2. Acute nonintractable headache, unspecified headache type         Debby Freiberg, MD 12/09/14 [phone]

## 2014-12-16 ENCOUNTER — Telehealth: Payer: Self-pay

## 2014-12-16 NOTE — Telephone Encounter (Signed)
LVM to CB and Confirm New pt appt.with Dr Marin Olp on 02/07/15 starting at 10:00

## 2015-02-07 ENCOUNTER — Ambulatory Visit

## 2015-02-07 ENCOUNTER — Other Ambulatory Visit

## 2015-02-07 ENCOUNTER — Encounter: Admitting: Hematology & Oncology

## 2015-02-07 ENCOUNTER — Encounter: Payer: Self-pay | Admitting: Hematology & Oncology

## 2015-02-07 DIAGNOSIS — D72819 Decreased white blood cell count, unspecified: Secondary | ICD-10-CM

## 2015-02-07 HISTORY — DX: Decreased white blood cell count, unspecified: D72.819

## 2015-02-10 ENCOUNTER — Encounter (HOSPITAL_COMMUNITY): Payer: Self-pay

## 2015-02-10 ENCOUNTER — Emergency Department (HOSPITAL_COMMUNITY)
Admission: EM | Admit: 2015-02-10 | Discharge: 2015-02-10 | Disposition: A | Discharge status: Home / Self Care | Attending: Emergency Medicine | Admitting: Emergency Medicine

## 2015-02-10 DIAGNOSIS — E079 Disorder of thyroid, unspecified: Secondary | ICD-10-CM | POA: Diagnosis not present

## 2015-02-10 DIAGNOSIS — I10 Essential (primary) hypertension: Secondary | ICD-10-CM | POA: Insufficient documentation

## 2015-02-10 DIAGNOSIS — Z79899 Other long term (current) drug therapy: Secondary | ICD-10-CM | POA: Insufficient documentation

## 2015-02-10 DIAGNOSIS — Z7952 Long term (current) use of systemic steroids: Secondary | ICD-10-CM | POA: Insufficient documentation

## 2015-02-10 DIAGNOSIS — M25551 Pain in right hip: Secondary | ICD-10-CM | POA: Diagnosis present

## 2015-02-10 DIAGNOSIS — D72819 Decreased white blood cell count, unspecified: Secondary | ICD-10-CM | POA: Insufficient documentation

## 2015-02-10 DIAGNOSIS — K219 Gastro-esophageal reflux disease without esophagitis: Secondary | ICD-10-CM | POA: Insufficient documentation

## 2015-02-10 DIAGNOSIS — M5441 Lumbago with sciatica, right side: Secondary | ICD-10-CM

## 2015-02-10 DIAGNOSIS — Z9889 Other specified postprocedural states: Secondary | ICD-10-CM | POA: Diagnosis not present

## 2015-02-10 MED ORDER — METHOCARBAMOL 500 MG PO TABS: 500.0000 mg | ORAL_TABLET | Freq: Two times a day (BID) | ORAL | Status: DC | PRN

## 2015-02-10 NOTE — Discharge Instructions (Signed)
Please follow up with your primary care provider. You can talk to her about taking gabapentin.    Back Exercises Back exercises help treat and prevent back injuries. The goal of back exercises is to increase the strength of your abdominal and back muscles and the flexibility of your back. These exercises should be started when you no longer have back pain. Back exercises include:  Pelvic Tilt. Lie on your back with your knees bent. Tilt your pelvis until the lower part of your back is against the floor. Hold this position 5 to 10 sec and repeat 5 to 10 times.  Knee to Chest. Pull first 1 knee up against your chest and hold for 20 to 30 seconds, repeat this with the other knee, and then both knees. This may be done with the other leg straight or bent, whichever feels better.  Sit-Ups or Curl-Ups. Bend your knees 90 degrees. Start with tilting your pelvis, and do a partial, slow sit-up, lifting your trunk only 30 to 45 degrees off the floor. Take at least 2 to 3 seconds for each sit-up. Do not do sit-ups with your knees out straight. If partial sit-ups are difficult, simply do the above but with only tightening your abdominal muscles and holding it as directed.  Hip-Lift. Lie on your back with your knees flexed 90 degrees. Push down with your feet and shoulders as you raise your hips a couple inches off the floor; hold for 10 seconds, repeat 5 to 10 times.  Back arches. Lie on your stomach, propping yourself up on bent elbows. Slowly press on your hands, causing an arch in your low back. Repeat 3 to 5 times. Any initial stiffness and discomfort should lessen with repetition over time.  Shoulder-Lifts. Lie face down with arms beside your body. Keep hips and torso pressed to floor as you slowly lift your head and shoulders off the floor. Do not overdo your exercises, especially in the beginning. Exercises may cause you some mild back discomfort which lasts for a few minutes; however, if the pain is more  severe, or lasts for more than 15 minutes, do not continue exercises until you see your caregiver. Improvement with exercise therapy for back problems is slow.  See your caregivers for assistance with developing a proper back exercise program. Document Released: 06/14/2004 Document Revised: 07/30/2011 Document Reviewed: 03/08/2011 [facility] Patient Information 2015 [place], [place]. This information is not intended to replace advice given to you by your health care provider. Make sure you discuss any questions you have with your health care provider. Sciatica Sciatica is pain, weakness, numbness, or tingling along the path of the sciatic nerve. The nerve starts in the lower back and runs down the back of each leg. The nerve controls the muscles in the lower leg and in the back of the knee, while also providing sensation to the back of the thigh, lower leg, and the sole of your foot. Sciatica is a symptom of another medical condition. For instance, nerve damage or certain conditions, such as a herniated disk or bone spur on the spine, pinch or put pressure on the sciatic nerve. This causes the pain, weakness, or other sensations normally associated with sciatica. Generally, sciatica only affects one side of the body. CAUSES   Herniated or slipped disc.  Degenerative disk disease.  A pain disorder involving the narrow muscle in the buttocks (piriformis syndrome).  Pelvic injury or fracture.  Pregnancy.  Tumor (rare). SYMPTOMS  Symptoms can vary from mild to  very severe. The symptoms usually travel from the low back to the buttocks and down the back of the leg. Symptoms can include:  Mild tingling or dull aches in the lower back, leg, or hip.  Numbness in the back of the calf or sole of the foot.  Burning sensations in the lower back, leg, or hip.  Sharp pains in the lower back, leg, or hip.  Leg weakness.  Severe back pain inhibiting movement. These symptoms may get worse with coughing,  sneezing, laughing, or prolonged sitting or standing. Also, being overweight may worsen symptoms. DIAGNOSIS  Your caregiver will perform a physical exam to look for common symptoms of sciatica. He or she may ask you to do certain movements or activities that would trigger sciatic nerve pain. Other tests may be performed to find the cause of the sciatica. These may include:  Blood tests.  X-rays.  Imaging tests, such as an MRI or CT scan. TREATMENT  Treatment is directed at the cause of the sciatic pain. Sometimes, treatment is not necessary and the pain and discomfort goes away on its own. If treatment is needed, your caregiver may suggest:  Over-the-counter medicines to relieve pain.  Prescription medicines, such as anti-inflammatory medicine, muscle relaxants, or narcotics.  Applying heat or ice to the painful area.  Steroid injections to lessen pain, irritation, and inflammation around the nerve.  Reducing activity during periods of pain.  Exercising and stretching to strengthen your abdomen and improve flexibility of your spine. Your caregiver may suggest losing weight if the extra weight makes the back pain worse.  Physical therapy.  Surgery to eliminate what is pressing or pinching the nerve, such as a bone spur or part of a herniated disk. HOME CARE INSTRUCTIONS   Only take over-the-counter or prescription medicines for pain or discomfort as directed by your caregiver.  Apply ice to the affected area for 20 minutes, 3-4 times a day for the first 48-72 hours. Then try heat in the same way.  Exercise, stretch, or perform your usual activities if these do not aggravate your pain.  Attend physical therapy sessions as directed by your caregiver.  Keep all follow-up appointments as directed by your caregiver.  Do not wear high heels or shoes that do not provide proper support.  Check your mattress to see if it is too soft. A firm mattress may lessen your pain and  discomfort. SEEK IMMEDIATE MEDICAL CARE IF:   You lose control of your bowel or bladder (incontinence).  You have increasing weakness in the lower back, pelvis, buttocks, or legs.  You have redness or swelling of your back.  You have a burning sensation when you urinate.  You have pain that gets worse when you lie down or awakens you at night.  Your pain is worse than you have experienced in the past.  Your pain is lasting longer than 4 weeks.  You are suddenly losing weight without reason. MAKE SURE YOU:  Understand these instructions.  Will watch your condition.  Will get help right away if you are not doing well or get worse. Document Released: 05/01/2001 Document Revised: 11/06/2011 Document Reviewed: 09/16/2011 [facility] Patient Information 2015 [place], [place]. This information is not intended to replace advice given to you by your health care provider. Make sure you discuss any questions you have with your health care provider.

## 2015-02-10 NOTE — ED Provider Notes (Signed)
CSN: [id number]     Arrival date & time 02/10/15  1947 History   First MD Initiated Contact with Patient 02/10/15 2118     Chief Complaint  Patient presents with  . Hip Pain   Beth Potts is a 64 y.o. female with a history of hypertension and hyperlipidemia who presents to the emergency department complaining of right low back pain that radiates down her posterior right leg. She reports she's been having this pain for about 6 months but this has worsened in the past 6 weeks. She reports she was seen by her primary care doctor this week who diagnosed her with sciatica and gave her a cortisone shot and prescribed her hydrocodone and prednisone. She reports that today her pain is worsened as she was standing on her feet and walking around frequently today. Patient complains of 6 out of 10 pain starts in her posterior buttocks and radiates down her posterior right leg. She reports her pain is worse with movement and after frequent periods of sitting. The patient is requesting an MRI tonight. The patient denies fevers, chills, recent illness, history cancer, history of IV drug use, recent changes to her weight, loss of bladder control, loss of bowel control, numbness, tingling, weakness, urinary symptoms, rashes, abdominal pain, nausea, vomiting or diarrhea  (Consider location/radiation/quality/duration/timing/severity/associated sxs/prior Treatment) HPI  Past Medical History  Diagnosis Date  . Thyroid disease   . Hypertension   . Hyperlipidemia   . Leukopenia 02/07/2015   Past Surgical History  Procedure Laterality Date  . Rotator cuff repair Bilateral   . Joint replacement Right     knee   History reviewed. No pertinent family history. Social History  Substance Use Topics  . Smoking status: Never Smoker   . Smokeless tobacco: None  . Alcohol Use: No   OB History    No data available     Review of Systems  Constitutional: Negative for fever and chills.  HENT: Negative for  congestion and sore throat.   Eyes: Negative for visual disturbance.  Respiratory: Negative for cough and shortness of breath.   Cardiovascular: Negative for chest pain and palpitations.  Gastrointestinal: Negative for nausea, vomiting, abdominal pain and diarrhea.  Genitourinary: Negative for dysuria, urgency, frequency and difficulty urinating.  Musculoskeletal: Positive for back pain. Negative for neck pain.  Skin: Negative for rash.  Neurological: Negative for dizziness, weakness, light-headedness, numbness and headaches.      Allergies  Review of patient's allergies indicates no known allergies.  Home Medications   Prior to Admission medications   Medication Sig Start Date End Date Taking? Authorizing Provider  dexlansoprazole (DEXILANT) 60 MG capsule Take 60 mg by mouth daily.    Yes Historical Provider, MD  folic acid (FOLVITE) 1 MG tablet Take 1 mg by mouth daily.   Yes Historical Provider, MD  Hydrocodone-Acetaminophen 5-300 MG TABS Take 1 tablet by mouth every 4 (four) hours as needed (pain).  02/08/15  Yes Historical Provider, MD  levothyroxine (SYNTHROID, LEVOTHROID) 112 MCG tablet Take 112 mcg by mouth daily before breakfast.   Yes Historical Provider, MD  LORazepam (ATIVAN) 0.5 MG tablet Take 0.5 mg by mouth at bedtime as needed for anxiety or sleep.   Yes Historical Provider, MD  MAGNESIUM PO Take 1 tablet by mouth daily.   Yes Historical Provider, MD  meloxicam (MOBIC) 7.5 MG tablet Take 7.5-15 mg by mouth daily as needed for pain.   Yes Historical Provider, MD  metoprolol succinate (TOPROL-XL) 25 MG 24  hr tablet Take 25 mg by mouth daily.   Yes Historical Provider, MD  montelukast (SINGULAIR) 10 MG tablet Take 10 mg by mouth at bedtime.    Yes Historical Provider, MD  olmesartan (BENICAR) 20 MG tablet Take 10 mg by mouth daily.    Yes Historical Provider, MD  Omega-3 Fatty Acids (FISH OIL PO) Take 1 tablet by mouth daily.   Yes Historical Provider, MD  predniSONE  (DELTASONE) 10 MG tablet Take 10 mg by mouth See admin instructions. Take 6 tabs on the first day, decreasing by 1 tablet until gone.   Yes Historical Provider, MD  amitriptyline (ELAVIL) 25 MG tablet Take 1 tablet (25 mg total) by mouth at bedtime. Patient not taking: Reported on 12/09/2014 04/28/14   Bronson Ing, DPM  meloxicam (MOBIC) 15 MG tablet Take 1 tablet (15 mg total) by mouth daily. Patient not taking: Reported on 12/09/2014 11/25/13   Bronson Ing, DPM  methocarbamol (ROBAXIN) 500 MG tablet Take 1 tablet (500 mg total) by mouth 2 (two) times daily as needed for muscle spasms. 02/10/15   Waynetta Pean, PA-C   BP 154/88 mmHg  Pulse 61  Temp(Src) 97.8 F (36.6 C) (Oral)  Resp 18  SpO2 95% Physical Exam  Constitutional: She is oriented to person, place, and time. She appears well-developed and well-nourished. No distress.  Nontoxic appearing.  HENT:  Head: Normocephalic and atraumatic.  Mouth/Throat: Oropharynx is clear and moist.  Eyes: Conjunctivae are normal. Pupils are equal, round, and reactive to light. Right eye exhibits no discharge. Left eye exhibits no discharge.  Neck: Neck supple.  Cardiovascular: Normal rate, regular rhythm, normal heart sounds and intact distal pulses.  Exam reveals no gallop and no friction rub.   No murmur heard. Bilateral radial, posterior tibialis and dorsalis pedis pulses are intact.    Pulmonary/Chest: Effort normal and breath sounds normal. No respiratory distress. She has no wheezes. She has no rales.  Abdominal: Soft. There is no tenderness. There is no guarding.  Musculoskeletal: Normal range of motion. She exhibits no edema or tenderness.  No midline neck or back tenderness. No back edema, deformity, ecchymosis or erythema noted. The patient is able to ambulate without difficulty or assistance. She has 5 out of 5 strength in her bilateral lower extremities. Her back is nontender to palpation.  Lymphadenopathy:    She has no cervical  adenopathy.  Neurological: She is alert and oriented to person, place, and time. She has normal reflexes. She displays normal reflexes. Coordination normal.  The patient is alert and oriented 3. Bilateral patellar DTRs are intact. Sensation is intact her bilateral lower extremities. She is able to ambulate with normal gait.  Skin: Skin is warm and dry. No rash noted. She is not diaphoretic. No erythema. No pallor.  Psychiatric: She has a normal mood and affect. Her behavior is normal.  Nursing note and vitals reviewed.   ED Course  Procedures (including critical care time) Labs Review Labs Reviewed - No data to display  Imaging Review No results found.    EKG Interpretation None      Filed Vitals:   02/10/15 2007 02/10/15 2246  BP: 150/82 154/88  Pulse: 72 61  Temp: 97.8 F (36.6 C)   TempSrc: Oral   Resp: 20 18  SpO2: 98% 95%     MDM   Meds given in ED:  Medications - No data to display  Discharge Medication List as of 02/10/2015 11:13 PM    START  taking these medications   Details  methocarbamol (ROBAXIN) 500 MG tablet Take 1 tablet (500 mg total) by mouth 2 (two) times daily as needed for muscle spasms., Starting 02/10/2015, Until Discontinued, Print        Final diagnoses:  Right-sided low back pain with right-sided sciatica   Patient with right low back pain that radiates down her posterior leg and is consistent with sciatica.  No neurological deficits and normal neuro exam.  Patient can walk with normal gait.  No loss of bowel or bladder control.  No concern for cauda equina.  No fever, night sweats, weight loss, h/o cancer, IVDU. Patient was started on Vicodin and prednisone by her PCP. Patient's most recent blood work indicates elevated creatinine. This disqualifies her for any NSAIDs. She also reports history of GERD. I suggested that we try a muscle relaxer and advised that this could make her drowsy and to use caution while taking this medicine. I advised  she could continue using Norco as needed for her pain as well. I advised her to follow-up with primary care and to possibly discuss the use of gabapentin for her nerve pain. We also talked about MRI and that she could discuss with her PCP about further imaging but I do not feel emergent MRI is needed here tonight. She understands and is in agreement. I educated on back exercises and use of ice. I advised the patient to follow-up with their primary care provider this week. I advised the patient to return to the emergency department with new or worsening symptoms or new concerns. The patient verbalized understanding and agreement with plan.      Waynetta Pean, PA-C 02/11/15 [place], DO 02/11/15 1535

## 2015-02-10 NOTE — ED Notes (Signed)
Pt complains of right hip pain, saw her MD Tuesday and received a cortisone injection, prednisone tablets and muscle relaxers, she states the pain got better but she was on her feet all day today and now the pain is worse, patient would like an MRI

## 2015-02-16 NOTE — Progress Notes (Signed)
This encounter was created in error - please disregard.

## 2015-10-14 ENCOUNTER — Other Ambulatory Visit: Payer: Self-pay | Admitting: *Deleted

## 2015-10-14 DIAGNOSIS — I83813 Varicose veins of bilateral lower extremities with pain: Secondary | ICD-10-CM

## 2015-10-21 ENCOUNTER — Ambulatory Visit
Admission: RE | Admit: 2015-10-21 | Discharge: 2015-10-21 | Disposition: A | Discharge status: Home / Self Care | Source: Ambulatory Visit | Attending: Internal Medicine | Admitting: Internal Medicine

## 2015-10-21 ENCOUNTER — Other Ambulatory Visit: Payer: Self-pay | Admitting: Internal Medicine

## 2015-10-21 DIAGNOSIS — M79604 Pain in right leg: Secondary | ICD-10-CM

## 2015-10-21 DIAGNOSIS — M25551 Pain in right hip: Secondary | ICD-10-CM

## 2015-11-25 ENCOUNTER — Encounter: Payer: Self-pay | Admitting: Vascular Surgery

## 2015-11-28 ENCOUNTER — Ambulatory Visit (INDEPENDENT_AMBULATORY_CARE_PROVIDER_SITE_OTHER): Admitting: Vascular Surgery

## 2015-11-28 ENCOUNTER — Encounter: Payer: Self-pay | Admitting: Vascular Surgery

## 2015-11-28 VITALS — BP 138/76 | HR 79 | Temp 97.6°F | Resp 14 | Ht 66.0 in | Wt 223.0 lb

## 2015-11-28 DIAGNOSIS — I8393 Asymptomatic varicose veins of bilateral lower extremities: Secondary | ICD-10-CM | POA: Diagnosis not present

## 2015-11-28 NOTE — Progress Notes (Signed)
Subjective:     Patient ID: Beth Potts, female   DOB: 04/27/1951, 65 y.o.   MRN: [id number]  HPI this 65 year old female is referred by Darleen Crocker, nurse practitioner for evaluation of painful varicosities in both legs. Patient has described itching and burning discomfort in both legs particular below the knees which worsens as the day progresses. She works on her feet during the day. She does were short leg elastic compression stockings which helps control her swelling. She has no history of DVT, thrombophlebitis, stasis ulcers, or bleeding. She does not take anticoagulants.  Past Medical History  Diagnosis Date  . Thyroid disease   . Hypertension   . Hyperlipidemia   . Leukopenia 02/07/2015  . Varicose veins     Social History  Substance Use Topics  . Smoking status: Never Smoker   . Smokeless tobacco: Not on file  . Alcohol Use: No    Family History  Problem Relation Age of Onset  . Heart disease Mother   . Stroke Mother   . Heart disease Father     No Known Allergies   Current outpatient prescriptions:  .  amitriptyline (ELAVIL) 25 MG tablet, Take 1 tablet (25 mg total) by mouth at bedtime., Disp: 30 tablet, Rfl: 2 .  dexlansoprazole (DEXILANT) 60 MG capsule, Take 60 mg by mouth daily. , Disp: , Rfl:  .  folic acid (FOLVITE) 1 MG tablet, Take 1 mg by mouth daily., Disp: , Rfl:  .  Hydrocodone-Acetaminophen 5-300 MG TABS, Take 1 tablet by mouth every 4 (four) hours as needed (pain). , Disp: , Rfl:  .  levothyroxine (SYNTHROID, LEVOTHROID) 112 MCG tablet, Take 112 mcg by mouth daily before breakfast., Disp: , Rfl:  .  MAGNESIUM PO, Take 1 tablet by mouth daily., Disp: , Rfl:  .  meloxicam (MOBIC) 15 MG tablet, Take 1 tablet (15 mg total) by mouth daily., Disp: 30 tablet, Rfl: 2 .  meloxicam (MOBIC) 7.5 MG tablet, Take 7.5-15 mg by mouth daily as needed for pain., Disp: , Rfl:  .  methocarbamol (ROBAXIN) 500 MG tablet, Take 1 tablet (500 mg total) by mouth 2 (two) times  daily as needed for muscle spasms., Disp: 20 tablet, Rfl: 0 .  metoprolol succinate (TOPROL-XL) 25 MG 24 hr tablet, Take 25 mg by mouth daily., Disp: , Rfl:  .  montelukast (SINGULAIR) 10 MG tablet, Take 10 mg by mouth at bedtime. , Disp: , Rfl:  .  olmesartan (BENICAR) 20 MG tablet, Take 10 mg by mouth daily. , Disp: , Rfl:  .  Omega-3 Fatty Acids (FISH OIL PO), Take 1 tablet by mouth daily., Disp: , Rfl:  .  LORazepam (ATIVAN) 0.5 MG tablet, Take 0.5 mg by mouth at bedtime as needed for anxiety or sleep. Reported on 11/28/2015, Disp: , Rfl:  .  predniSONE (DELTASONE) 10 MG tablet, Take 10 mg by mouth See admin instructions. Reported on 11/28/2015, Disp: , Rfl:   Filed Vitals:   11/28/15 1029  BP: 138/76  Pulse: 79  Temp: 97.6 F (36.4 C)  Resp: 14  Height: 5\' 6"  (1.676 m)  Weight: 223 lb (101.152 kg)  SpO2: 97%    Body mass index is 36.01 kg/(m^2).           Review of Systems patient has history of hypertension, hyperlipidemia but denies chest pain, dyspnea on exertion, PND, orthopnea, hemoptysis, claudication. Other systems negative and complete review of systems     Objective:   Physical Exam BP  138/76 mmHg  Pulse 79  Temp(Src) 97.6 F (36.4 C)  Resp 14  Ht 5\' 6"  (1.676 m)  Wt 223 lb (101.152 kg)  BMI 36.01 kg/m2  SpO2 97%    Gen.-alert and oriented x3 in no apparent distress HEENT normal for age Lungs no rhonchi or wheezing Cardiovascular regular rhythm no murmurs carotid pulses 3+ palpable no bruits audible Abdomen soft nontender no palpable masses Musculoskeletal free of  major deformities Skin clear -no rashes Neurologic normal Lower extremities 3+ femoral and dorsalis pedis pulses palpable bilaterally with no edema Diffuse spider veins bilaterally in the anterior medial and lateral thighs and medial and lateral EXTENDING onto the dorsum of foot. No hyperpigmentation noted. No large bulging varicosities noted. No ulceration noted. Minimal edema.  Today  I performed a bedside SonoSite-ultrasound exam on both lower extremities. A she has normal-sized great saphenous veins with no evidence of reflux       Assessment:     Bilateral extensive spider veins with no evidence of superficial venous reflux on ultrasound exam Hypertension Hyperlipidemia    Plan:     Discussed with patient the fact that foam sclerotherapy would be the treatment of choice if she decides on treatment. She will notify Kathlee Nations for further discussion regarding this Does not require any laser ablation or other significant intervention

## 2016-01-04 ENCOUNTER — Other Ambulatory Visit: Payer: Self-pay | Admitting: Family Medicine

## 2016-01-04 DIAGNOSIS — Z1231 Encounter for screening mammogram for malignant neoplasm of breast: Secondary | ICD-10-CM

## 2016-02-10 ENCOUNTER — Encounter (HOSPITAL_COMMUNITY)

## 2016-02-10 ENCOUNTER — Encounter: Admitting: Vascular Surgery

## 2016-08-29 ENCOUNTER — Other Ambulatory Visit: Payer: Self-pay | Admitting: Family Medicine

## 2016-08-29 ENCOUNTER — Other Ambulatory Visit (HOSPITAL_COMMUNITY)
Admission: RE | Admit: 2016-08-29 | Discharge: 2016-08-29 | Disposition: A | Discharge status: Home / Self Care | Source: Ambulatory Visit | Attending: Family Medicine | Admitting: Family Medicine

## 2016-08-29 DIAGNOSIS — Z1231 Encounter for screening mammogram for malignant neoplasm of breast: Secondary | ICD-10-CM

## 2016-08-29 DIAGNOSIS — Z01419 Encounter for gynecological examination (general) (routine) without abnormal findings: Secondary | ICD-10-CM | POA: Insufficient documentation

## 2016-08-30 ENCOUNTER — Ambulatory Visit
Admission: RE | Admit: 2016-08-30 | Discharge: 2016-08-30 | Disposition: A | Discharge status: Home / Self Care | Source: Ambulatory Visit | Attending: Family Medicine | Admitting: Family Medicine

## 2016-08-30 DIAGNOSIS — Z1231 Encounter for screening mammogram for malignant neoplasm of breast: Secondary | ICD-10-CM

## 2016-08-31 LAB — CYTOLOGY - PAP: Diagnosis: NEGATIVE

## 2017-03-26 ENCOUNTER — Observation Stay (HOSPITAL_BASED_OUTPATIENT_CLINIC_OR_DEPARTMENT_OTHER)

## 2017-03-26 ENCOUNTER — Encounter (HOSPITAL_COMMUNITY): Payer: Self-pay

## 2017-03-26 ENCOUNTER — Emergency Department (HOSPITAL_COMMUNITY)

## 2017-03-26 ENCOUNTER — Other Ambulatory Visit: Payer: Self-pay

## 2017-03-26 ENCOUNTER — Inpatient Hospital Stay (HOSPITAL_COMMUNITY)
Admission: EM | Admit: 2017-03-26 | Discharge: 2017-03-28 | DRG: 176 | Disposition: A | Discharge status: Home / Self Care | Attending: Internal Medicine | Admitting: Internal Medicine

## 2017-03-26 ENCOUNTER — Emergency Department (HOSPITAL_BASED_OUTPATIENT_CLINIC_OR_DEPARTMENT_OTHER)
Admit: 2017-03-26 | Discharge: 2017-03-26 | Disposition: A | Discharge status: Home / Self Care | Attending: Emergency Medicine | Admitting: Emergency Medicine

## 2017-03-26 DIAGNOSIS — N183 Chronic kidney disease, stage 3 (moderate): Secondary | ICD-10-CM | POA: Diagnosis not present

## 2017-03-26 DIAGNOSIS — K219 Gastro-esophageal reflux disease without esophagitis: Secondary | ICD-10-CM | POA: Diagnosis not present

## 2017-03-26 DIAGNOSIS — Z7951 Long term (current) use of inhaled steroids: Secondary | ICD-10-CM

## 2017-03-26 DIAGNOSIS — Z79890 Hormone replacement therapy (postmenopausal): Secondary | ICD-10-CM

## 2017-03-26 DIAGNOSIS — Z96651 Presence of right artificial knee joint: Secondary | ICD-10-CM | POA: Diagnosis present

## 2017-03-26 DIAGNOSIS — E039 Hypothyroidism, unspecified: Secondary | ICD-10-CM | POA: Diagnosis present

## 2017-03-26 DIAGNOSIS — M7989 Other specified soft tissue disorders: Secondary | ICD-10-CM | POA: Diagnosis not present

## 2017-03-26 DIAGNOSIS — Z791 Long term (current) use of non-steroidal anti-inflammatories (NSAID): Secondary | ICD-10-CM

## 2017-03-26 DIAGNOSIS — M79609 Pain in unspecified limb: Secondary | ICD-10-CM

## 2017-03-26 DIAGNOSIS — J329 Chronic sinusitis, unspecified: Secondary | ICD-10-CM | POA: Diagnosis not present

## 2017-03-26 DIAGNOSIS — J45909 Unspecified asthma, uncomplicated: Secondary | ICD-10-CM | POA: Diagnosis present

## 2017-03-26 DIAGNOSIS — I5030 Unspecified diastolic (congestive) heart failure: Secondary | ICD-10-CM

## 2017-03-26 DIAGNOSIS — Z823 Family history of stroke: Secondary | ICD-10-CM | POA: Diagnosis not present

## 2017-03-26 DIAGNOSIS — I2699 Other pulmonary embolism without acute cor pulmonale: Principal | ICD-10-CM

## 2017-03-26 DIAGNOSIS — I129 Hypertensive chronic kidney disease with stage 1 through stage 4 chronic kidney disease, or unspecified chronic kidney disease: Secondary | ICD-10-CM | POA: Diagnosis not present

## 2017-03-26 DIAGNOSIS — F419 Anxiety disorder, unspecified: Secondary | ICD-10-CM | POA: Diagnosis not present

## 2017-03-26 DIAGNOSIS — Z8249 Family history of ischemic heart disease and other diseases of the circulatory system: Secondary | ICD-10-CM

## 2017-03-26 DIAGNOSIS — R0602 Shortness of breath: Secondary | ICD-10-CM | POA: Diagnosis present

## 2017-03-26 LAB — CBC
HCT: 37.4 % (ref 36.0–46.0)
HEMOGLOBIN: 12.2 g/dL (ref 12.0–15.0)
MCH: 28 pg (ref 26.0–34.0)
MCHC: 32.6 g/dL (ref 30.0–36.0)
MCV: 86 fL (ref 78.0–100.0)
Platelets: 155 10*3/uL (ref 150–400)
RBC: 4.35 MIL/uL (ref 3.87–5.11)
RDW: 14.1 % (ref 11.5–15.5)
WBC: 4.7 10*3/uL (ref 4.0–10.5)

## 2017-03-26 LAB — COMPREHENSIVE METABOLIC PANEL
ALBUMIN: 3.8 g/dL (ref 3.5–5.0)
ALT: 19 U/L (ref 14–54)
ANION GAP: 8 (ref 5–15)
AST: 23 U/L (ref 15–41)
Alkaline Phosphatase: 79 U/L (ref 38–126)
BUN: 13 mg/dL (ref 6–20)
CO2: 27 mmol/L (ref 22–32)
Calcium: 8.9 mg/dL (ref 8.9–10.3)
Chloride: 105 mmol/L (ref 101–111)
Creatinine, Ser: 1.02 mg/dL — ABNORMAL HIGH (ref 0.44–1.00)
GFR calc Af Amer: >60 mL/min (ref >60)
GFR calc non Af Amer: 57 mL/min — ABNORMAL LOW (ref >60)
Glucose, Bld: 93 mg/dL (ref 65–99)
POTASSIUM: 3.7 mmol/L (ref 3.5–5.1)
SODIUM: 140 mmol/L (ref 135–145)
Total Bilirubin: 0.5 mg/dL (ref 0.3–1.2)
Total Protein: 7 g/dL (ref 6.5–8.1)

## 2017-03-26 LAB — HEPARIN LEVEL (UNFRACTIONATED): HEPARIN UNFRACTIONATED: 0.6 [IU]/mL (ref 0.30–0.70)

## 2017-03-26 IMAGING — CR DG CHEST 2V
2 series · 2 of 2 positions shown · non-contrast
Comparison: Chest x-ray of [DATE]

CLINICAL DATA: Three weeks of cough, chest congestion, and
shortness of breath. Recently diagnosed with bronchitis. Nonsmoker.

EXAM:
CHEST  2 VIEW

[w chest pa]
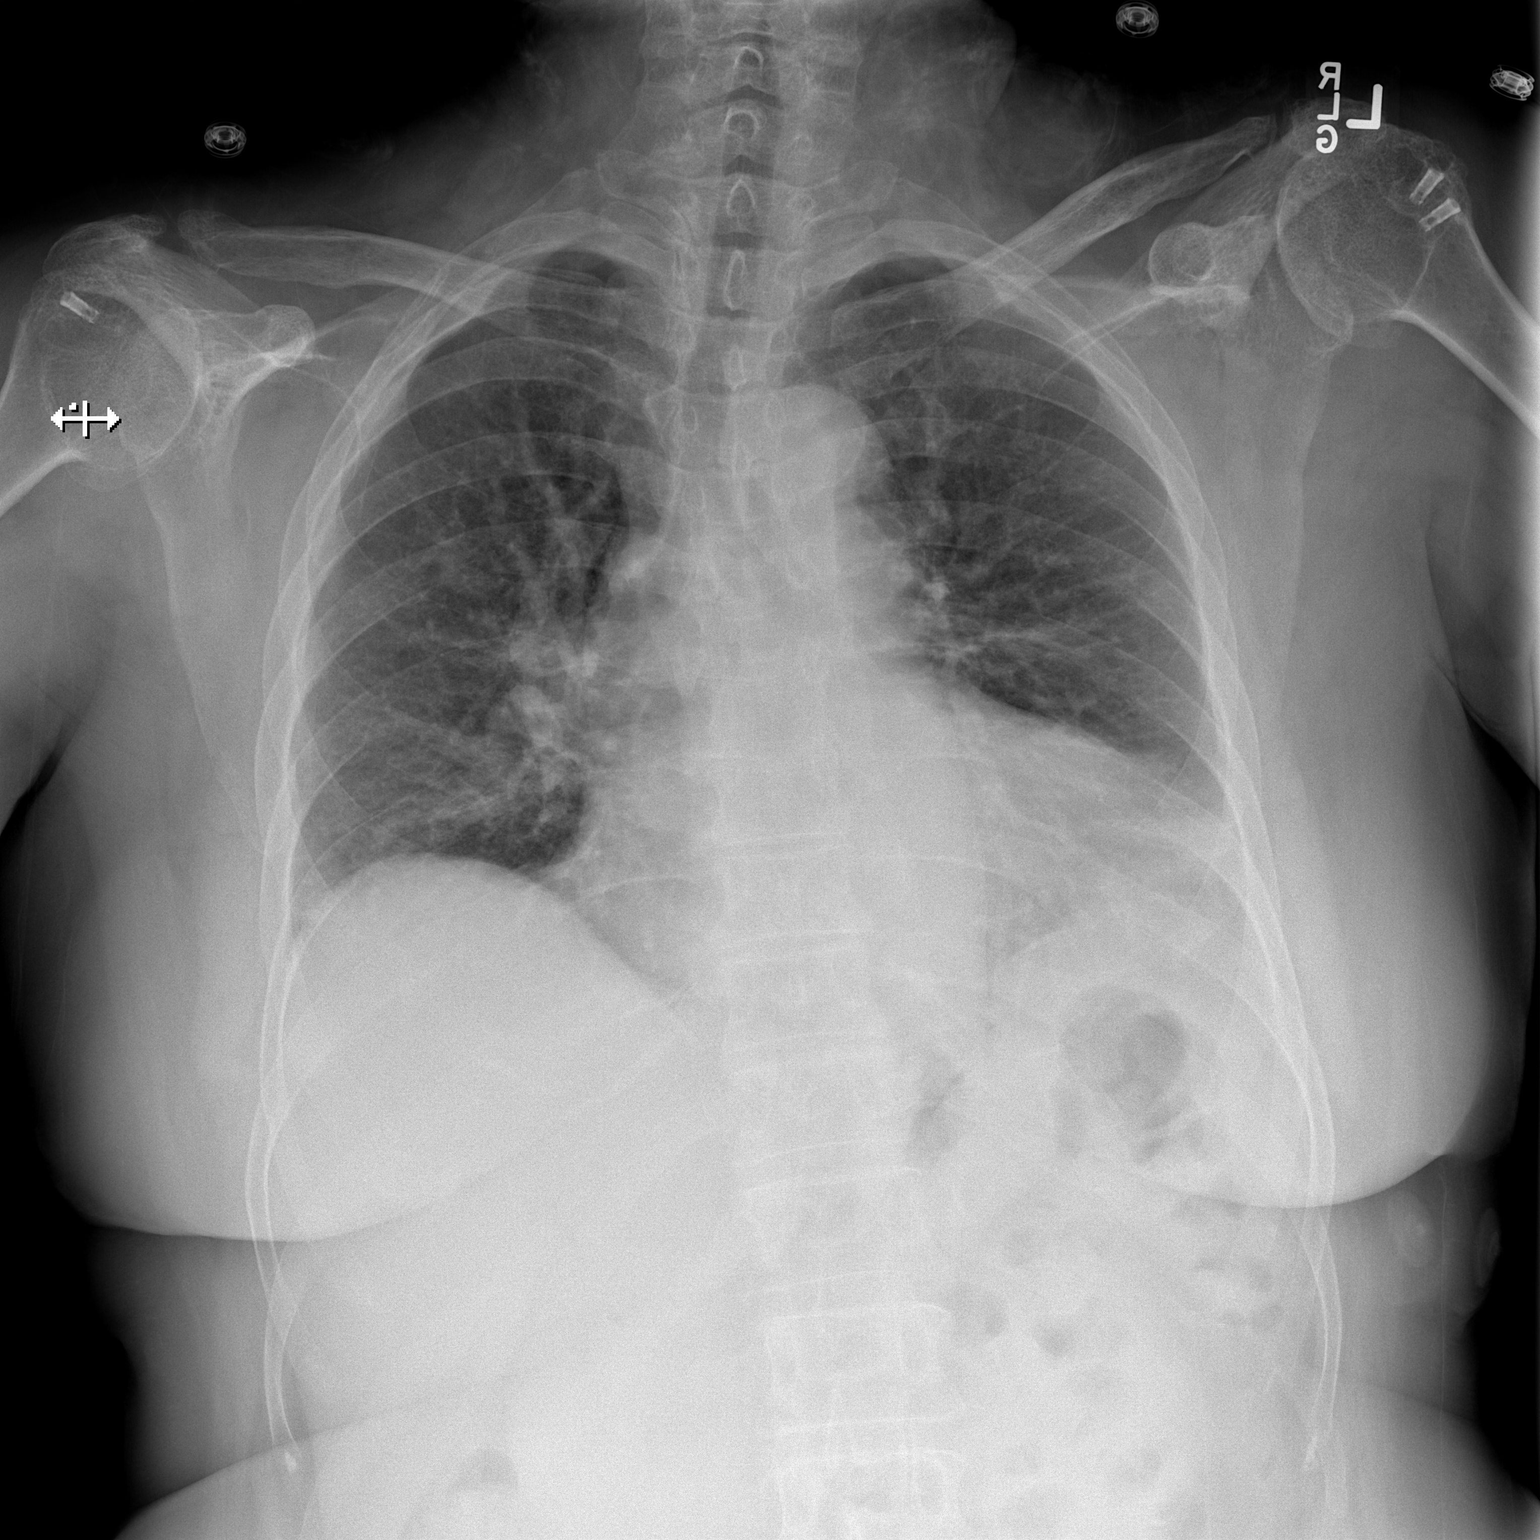

[w chest lat]
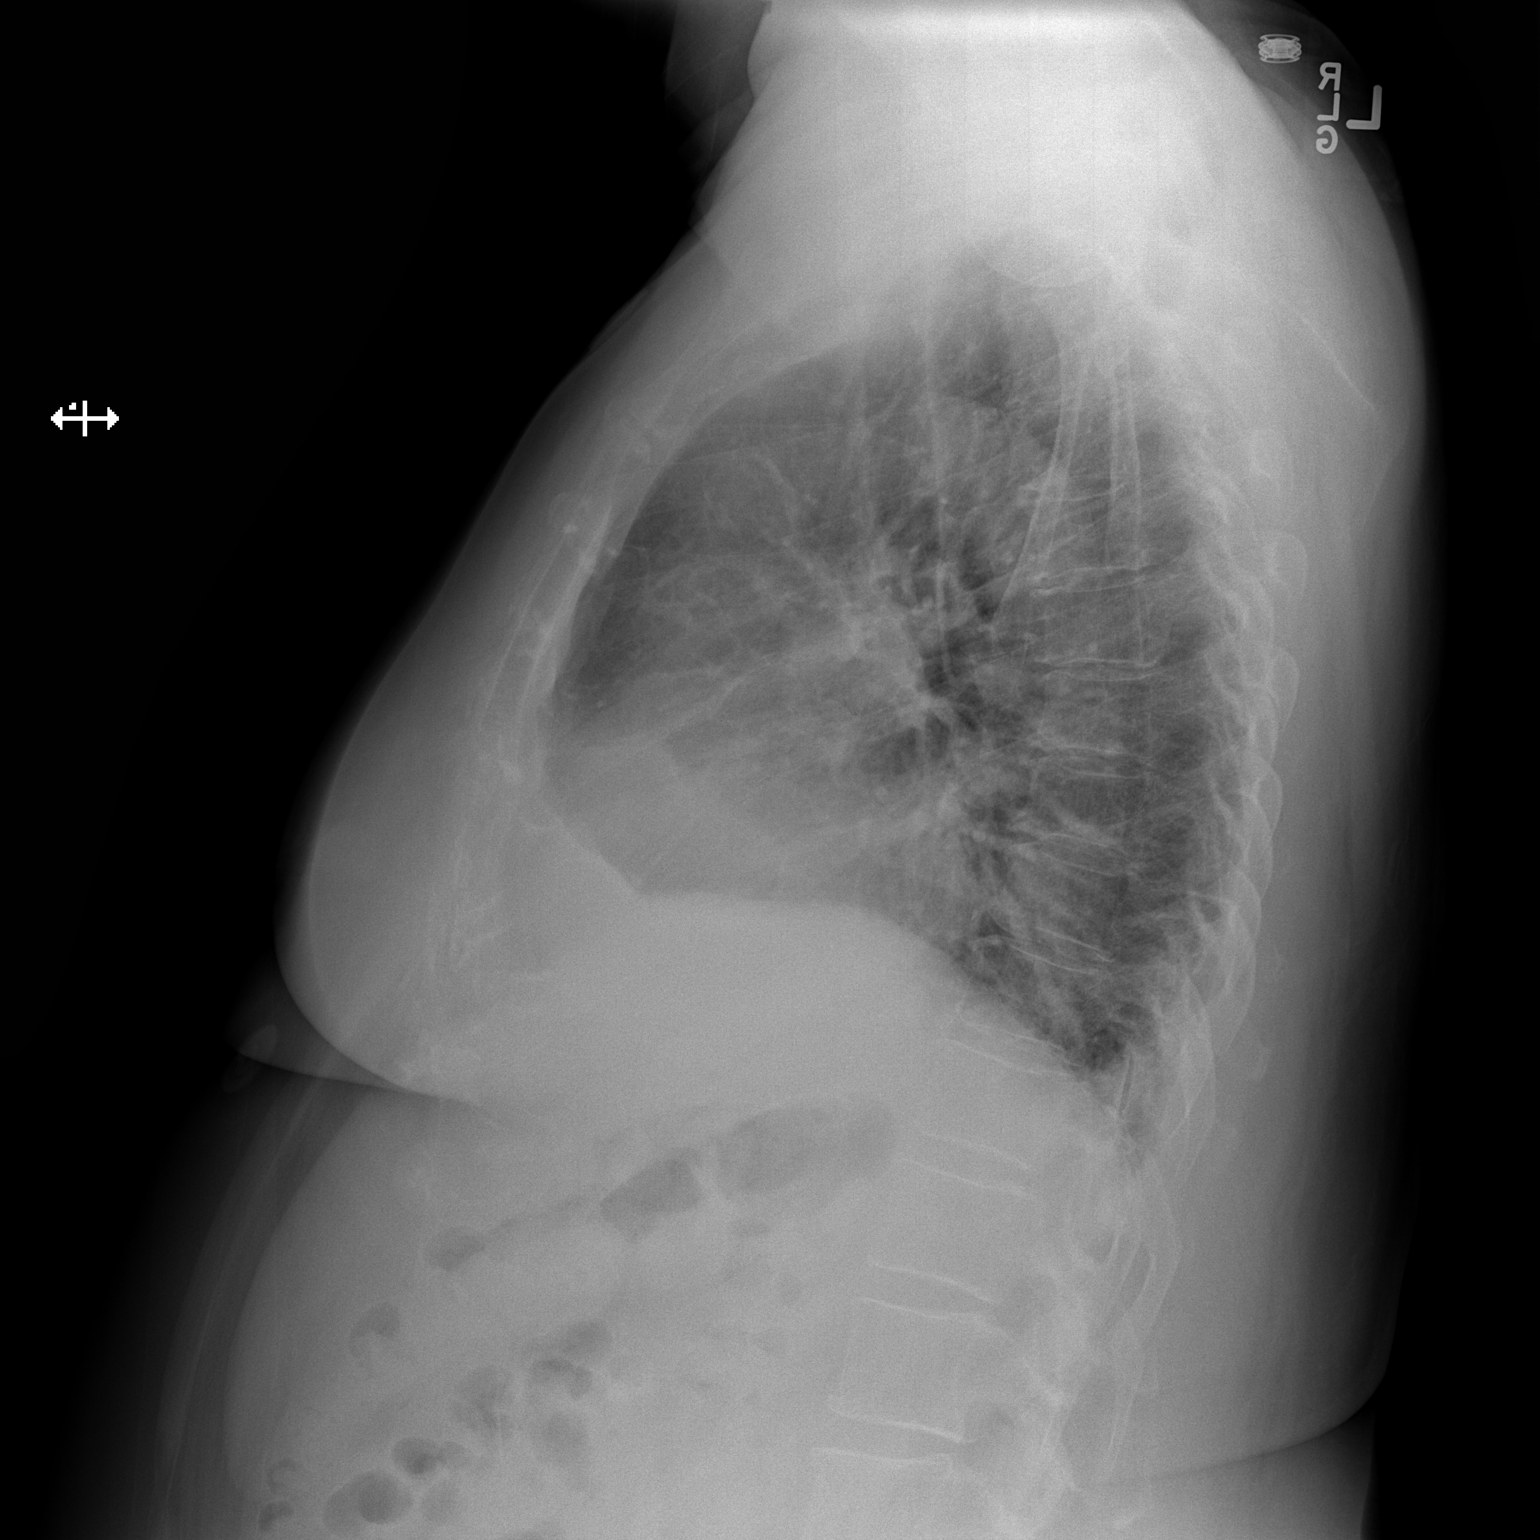

[2 of 2 positions shown; findings below may reference images not displayed]

FINDINGS: The lungs are mildly hypoinflated. There is increased density in the
retrocardiac region likely on the left. The cardiac silhouette is
enlarged. The pulmonary vascularity is mildly engorged. There is no
pleural effusion. There is calcification in the wall of the aortic
arch. There is mild multilevel degenerative disc disease of the
thoracic spine.
IMPRESSION: Hypoinflation limits the sensitivity of the study. However density
in the left lower lobe is consistent with pneumonia. In addition
there is likely superimposed mild CHF. Followup PA and lateral chest
X-ray is recommended in 3-4 weeks following trial of antibiotic
therapy to ensure resolution and exclude underlying malignancy.

Thoracic aortic atherosclerosis.

## 2017-03-26 MED ORDER — HEPARIN BOLUS VIA INFUSION
2500.0000 [IU] | Freq: Once | INTRAVENOUS | Status: AC
  Administered 2017-03-26: 2500 [IU] via INTRAVENOUS
  Filled 2017-03-26: qty 2500

## 2017-03-26 MED ORDER — IPRATROPIUM BROMIDE 0.02 % IN SOLN
0.5000 mg | Freq: Once | RESPIRATORY_TRACT | Status: AC
  Administered 2017-03-26: 0.5 mg via RESPIRATORY_TRACT
  Filled 2017-03-26: qty 2.5

## 2017-03-26 MED ORDER — MONTELUKAST SODIUM 10 MG PO TABS
10.0000 mg | ORAL_TABLET | Freq: Every day | ORAL | Status: DC
  Administered 2017-03-26 – 2017-03-27 (×2): 10 mg via ORAL
  Filled 2017-03-26 (×2): qty 1

## 2017-03-26 MED ORDER — FLUTICASONE PROPIONATE 50 MCG/ACT NA SUSP
2.0000 | Freq: Every day | NASAL | Status: DC
  Administered 2017-03-26 – 2017-03-28 (×3): 2 via NASAL
  Filled 2017-03-26: qty 16

## 2017-03-26 MED ORDER — FLUTICASONE PROPIONATE 50 MCG/ACT NA SUSP: 1.0000 | Freq: Every day | NASAL | Status: DC

## 2017-03-26 MED ORDER — IOPAMIDOL (ISOVUE-370) INJECTION 76%
100.0000 mL | Freq: Once | INTRAVENOUS | Status: AC | PRN
  Administered 2017-03-26: 100 mL via INTRAVENOUS

## 2017-03-26 MED ORDER — HEPARIN (PORCINE) IN NACL 100-0.45 UNIT/ML-% IJ SOLN
1400.0000 [IU]/h | INTRAMUSCULAR | Status: DC
  Administered 2017-03-26 – 2017-03-27 (×2): 1400 [IU]/h via INTRAVENOUS
  Filled 2017-03-26 (×2): qty 250

## 2017-03-26 MED ORDER — ALBUTEROL SULFATE (2.5 MG/3ML) 0.083% IN NEBU
5.0000 mg | INHALATION_SOLUTION | Freq: Once | RESPIRATORY_TRACT | Status: AC
  Administered 2017-03-26: 5 mg via RESPIRATORY_TRACT
  Filled 2017-03-26: qty 6

## 2017-03-26 MED ORDER — LORAZEPAM 0.5 MG PO TABS: 0.5000 mg | ORAL_TABLET | Freq: Every evening | ORAL | Status: DC | PRN

## 2017-03-26 MED ORDER — ALBUTEROL SULFATE (2.5 MG/3ML) 0.083% IN NEBU
2.5000 mg | INHALATION_SOLUTION | Freq: Four times a day (QID) | RESPIRATORY_TRACT | Status: DC | PRN
  Administered 2017-03-27: 2.5 mg via RESPIRATORY_TRACT
  Filled 2017-03-26: qty 3

## 2017-03-26 MED ORDER — LEVOTHYROXINE SODIUM 112 MCG PO TABS
112.0000 ug | ORAL_TABLET | Freq: Every day | ORAL | Status: DC
  Administered 2017-03-27 – 2017-03-28 (×2): 112 ug via ORAL
  Filled 2017-03-26 (×2): qty 1

## 2017-03-26 MED ORDER — IOPAMIDOL (ISOVUE-370) INJECTION 76%
INTRAVENOUS | Status: AC
  Filled 2017-03-26: qty 100

## 2017-03-26 MED ORDER — METOPROLOL SUCCINATE ER 25 MG PO TB24
25.0000 mg | ORAL_TABLET | Freq: Two times a day (BID) | ORAL | Status: DC
  Administered 2017-03-26 – 2017-03-28 (×4): 25 mg via ORAL
  Filled 2017-03-26 (×4): qty 1

## 2017-03-26 MED ORDER — FOLIC ACID 1 MG PO TABS
1.0000 mg | ORAL_TABLET | Freq: Every day | ORAL | Status: DC
  Administered 2017-03-27 – 2017-03-28 (×2): 1 mg via ORAL
  Filled 2017-03-26 (×2): qty 1

## 2017-03-26 MED ORDER — GUAIFENESIN ER 600 MG PO TB12
600.0000 mg | ORAL_TABLET | Freq: Two times a day (BID) | ORAL | Status: DC
  Administered 2017-03-26 – 2017-03-28 (×4): 600 mg via ORAL
  Filled 2017-03-26 (×4): qty 1

## 2017-03-26 MED ORDER — MELOXICAM 15 MG PO TABS
15.0000 mg | ORAL_TABLET | Freq: Every day | ORAL | Status: DC
  Administered 2017-03-26 – 2017-03-28 (×3): 15 mg via ORAL
  Filled 2017-03-26 (×3): qty 1

## 2017-03-26 NOTE — Progress Notes (Signed)
ANTICOAGULATION CONSULT NOTE - Initial Consult  Pharmacy Consult for IV heparin Indication: pulmonary embolus  Assessment: 66 yo female presented to ER with shortness of breath started on IV heparin for new PE.    First HL 0.6 is therapeutic on Heparin at 1400 units/hr.  Rn reports no bleeding or infusion complications.  Goal of Therapy:  Heparin level 0.3-0.7 units/ml Monitor platelets by anticoagulation protocol: Yes   Plan:   Continue heparin IV infusion at 1400 units/hr  Repeat Heparin level with AM labs to confirm therapeutic level    Gretta Arab PharmD, BCPS Pager [phone] 03/26/2017 8:51 PM

## 2017-03-26 NOTE — Progress Notes (Signed)
*  PRELIMINARY RESULTS* Echocardiogram 2D Echocardiogram has been performed.  Leavy Cella 03/26/2017, 3:35 PM

## 2017-03-26 NOTE — ED Notes (Signed)
ED TO INPATIENT HANDOFF REPORT  Name/Age/Gender Beth Potts 66 y.o. female  Code Status Code Status History    This patient does not have a recorded code status. Please follow your organizational policy for patients in this situation.      Home/SNF/Other Home  Chief Complaint Chest Pain; Shortness of breath  Level of Care/Admitting Diagnosis ED Disposition    ED Disposition Condition Comment   Admit  Hospital Area: [facility] [[id number]]  Level of Care: Telemetry [5]  Admit to tele based on following criteria: Other see comments  Comments: PE  Diagnosis: Pulmonary emboli [facility] [[id number]]  Admitting Physician: Florencia Reasons [[id number]]  Attending Physician: Florencia Reasons [[id number]]  PT Class (Do Not Modify): Observation [104]  PT Acc Code (Do Not Modify): Observation [10022]       Medical History Past Medical History:  Diagnosis Date  . Hyperlipidemia   . Hypertension   . Leukopenia 02/07/2015  . Thyroid disease   . Varicose veins     Allergies No Known Allergies  IV Location/Drains/Wounds Patient Lines/Drains/Airways Status   Active Line/Drains/Airways    Name:   Placement date:   Placement time:   Site:   Days:   Peripheral IV 03/26/17 Right Antecubital   03/26/17    0955    Antecubital   less than 1          Labs/Imaging Results for orders placed or performed during the hospital encounter of 03/26/17 (from the past 48 hour(s))  CBC     Status: None   Collection Time: 03/26/17  9:43 AM  Result Value Ref Range   WBC 4.7 4.0 - 10.5 K/uL   RBC 4.35 3.87 - 5.11 MIL/uL   Hemoglobin 12.2 12.0 - 15.0 g/dL   HCT 37.4 36.0 - 46.0 %   MCV 86.0 78.0 - 100.0 fL   MCH 28.0 26.0 - 34.0 pg   MCHC 32.6 30.0 - 36.0 g/dL   RDW 14.1 11.5 - 15.5 %   Platelets 155 150 - 400 K/uL  Comprehensive metabolic panel     Status: Abnormal   Collection Time: 03/26/17  9:43 AM  Result Value Ref Range   Sodium 140 135 - 145 mmol/L   Potassium 3.7 3.5 - 5.1 mmol/L    Chloride 105 101 - 111 mmol/L   CO2 27 22 - 32 mmol/L   Glucose, Bld 93 65 - 99 mg/dL   BUN 13 6 - 20 mg/dL   Creatinine, Ser 1.02 (H) 0.44 - 1.00 mg/dL   Calcium 8.9 8.9 - 10.3 mg/dL   Total Protein 7.0 6.5 - 8.1 g/dL   Albumin 3.8 3.5 - 5.0 g/dL   AST 23 15 - 41 U/L   ALT 19 14 - 54 U/L   Alkaline Phosphatase 79 38 - 126 U/L   Total Bilirubin 0.5 0.3 - 1.2 mg/dL   GFR calc non Af Amer 57 (L) >60 mL/min   GFR calc Af Amer >60 >60 mL/min    Comment: (NOTE) The eGFR has been calculated using the CKD EPI equation. This calculation has not been validated in all clinical situations. eGFR's persistently <60 mL/min signify possible Chronic Kidney Disease.    Anion gap 8 5 - 15   Dg Chest 2 View  Result Date: 03/26/2017 CLINICAL DATA:  Three weeks of cough, chest congestion, and shortness of breath. Recently diagnosed with bronchitis. Nonsmoker. EXAM: CHEST  2 VIEW COMPARISON:  Chest x-ray of August 22, 2012 FINDINGS: The lungs are  mildly hypoinflated. There is increased density in the retrocardiac region likely on the left. The cardiac silhouette is enlarged. The pulmonary vascularity is mildly engorged. There is no pleural effusion. There is calcification in the wall of the aortic arch. There is mild multilevel degenerative disc disease of the thoracic spine. IMPRESSION: Hypoinflation limits the sensitivity of the study. However density in the left lower lobe is consistent with pneumonia. In addition there is likely superimposed mild CHF. Followup PA and lateral chest X-ray is recommended in 3-4 weeks following trial of antibiotic therapy to ensure resolution and exclude underlying malignancy. Thoracic aortic atherosclerosis. Electronically Signed   By: David  Martinique M.D.   On: 03/26/2017 07:31   Ct Angio Chest Pe W And/or Wo Contrast  Result Date: 03/26/2017 CLINICAL DATA:  Shortness of breath and productive cough for several weeks, history of long training, initial encounter EXAM: CT  ANGIOGRAPHY CHEST WITH CONTRAST TECHNIQUE: Multidetector CT imaging of the chest was performed using the standard protocol during bolus administration of intravenous contrast. Multiplanar CT image reconstructions and MIPs were obtained to evaluate the vascular anatomy. CONTRAST:  100 mL Isovue 370. COMPARISON:  Plain film from earlier in the same day. FINDINGS: Cardiovascular: Mild atherosclerotic calcifications are noted in the thoracic aorta. No aneurysmal dilatation or dissection is identified. No significant coronary calcifications are noted. Mild cardiac enlargement is seen. The pulmonary artery shows a normal branching pattern. A few tiny emboli are noted within branches of the left lower lobe pulmonary artery. No large central pulmonary embolus is seen. Mediastinum/Nodes: Thoracic inlet is within normal limits. No sizable hilar or mediastinal adenopathy is noted. The esophagus as visualized shows evidence of a small sliding-type hiatal hernia. No other focal abnormality is seen. Lungs/Pleura: Mild emphysematous changes are noted. Mild dependent atelectatic changes are seen. No sizable parenchymal nodule is seen. No focal infiltrate is noted. Upper Abdomen: Visualized upper abdomen shows a tiny hypodensity in the dome of the right lobe of the liver likely representing a small cyst. Musculoskeletal: Degenerative changes of the thoracic spine are noted. No other acute bony abnormality is noted. Review of the MIP images confirms the above findings. IMPRESSION: Tiny left lower lobe pulmonary embolus. No large central pulmonary embolism is seen. Mild dependent changes and emphysematous changes. Small cyst in the dome of the right lobe of the liver. Aortic Atherosclerosis (ICD10-I70.0) and Emphysema (ICD10-J43.9). Electronically Signed   By: Inez Catalina M.D.   On: 03/26/2017 11:46   Dg Knee Complete 4 Views Left  Result Date: 03/26/2017 CLINICAL DATA:  Chronic left knee pain worsening over the past 2-3 weeks  with no known trauma. EXAM: LEFT KNEE - COMPLETE 4+ VIEW COMPARISON:  None in PACs FINDINGS: The bones are subjectively adequately mineralized. There is beaking of the tibial spines. Spurs arise from the articular margins of the femoral condyles and tibial plateaus. There is spurring of the articular margins of the patella. There is mild narrowing of the medial joint space. There is no acute or healing fracture. There is no joint effusion. 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 No acute bony abnormality. Electronically Signed   By: David  Martinique M.D.   On: 03/26/2017 07:32    Pending Labs Unresulted Labs (From admission, onward)   Start     Ordered   03/27/17 0500  CBC  Daily,   R     03/26/17 1248   03/26/17 2000  Heparin level (unfractionated)  Once-Timed,  R     03/26/17 1248      Vitals/Pain Today's Vitals   03/26/17 0919 03/26/17 0942 03/26/17 1042 03/26/17 1304  BP:  (!) 177/83 (!) 154/69 (!) 162/99  Pulse:  66 69 77  Resp:  '16 16 16  '$ Temp:      TempSrc:      SpO2: 98% 100% 97% 100%  Weight:      Height:      PainSc:        Isolation Precautions No active isolations  Medications Medications  iopamidol (ISOVUE-370) 76 % injection (not administered)  heparin ADULT infusion 100 units/mL (25000 units/294m sodium chloride 0.45%) (1,400 Units/hr Intravenous Transfusing/Transfer 03/26/17 1316)  albuterol (PROVENTIL) (2.5 MG/3ML) 0.083% nebulizer solution 5 mg (5 mg Nebulization Given 03/26/17 0919)  ipratropium (ATROVENT) nebulizer solution 0.5 mg (0.5 mg Nebulization Given 03/26/17 0919)  iopamidol (ISOVUE-370) 76 % injection 100 mL (100 mLs Intravenous Contrast Given 03/26/17 1103)  heparin bolus via infusion 2,500 Units (2,500 Units Intravenous Bolus from Bag 03/26/17 1303)    Mobility walks

## 2017-03-26 NOTE — Progress Notes (Signed)
ANTICOAGULATION CONSULT NOTE - Initial Consult  Pharmacy Consult for IV heparin Indication: pulmonary embolus  No Known Allergies  Patient Measurements: Height: 5\' 6"  (167.6 cm) Weight: 230 lb (104.3 kg) IBW/kg (Calculated) : 59.3 Heparin Dosing Weight: 83.1  Vital Signs: Temp: 98.1 F (36.7 C) (11/06 0547) Temp Source: Oral (11/06 0547) BP: 154/69 (11/06 1042) Pulse Rate: 69 (11/06 1042)  Labs: Recent Labs    03/26/17 0943  HGB 12.2  HCT 37.4  PLT 155  CREATININE 1.02*    Estimated Creatinine Clearance: 68 mL/min (A) (by C-G formula based on SCr of 1.02 mg/dL (H)).   Medical History: Past Medical History:  Diagnosis Date  . Hyperlipidemia   . Hypertension   . Leukopenia 02/07/2015  . Thyroid disease   . Varicose veins     Medications:  Scheduled:  . iopamidol       Infusions:    Assessment: 66 yo female presented to ER with shortness of breath now to start IV heparin for new PE. Baseline labs have been drawn.  Goal of Therapy:  Heparin level 0.3-0.7 units/ml Monitor platelets by anticoagulation protocol: Yes   Plan:  1) IV heparin 2500 unit bolus then 2) IV heparin 1400 units/hr infusion rate 3) Check heparin level 6 hours after start of IV heparin  4) Daily heparin level and CBC   Adrian Saran, PharmD, BCPS Pager [phone] 03/26/2017 12:47 PM

## 2017-03-26 NOTE — ED Notes (Signed)
[place] [phone]. Report [id number]

## 2017-03-26 NOTE — ED Provider Notes (Signed)
[facility] DEPT Provider Note   CSN: [id number] Arrival date & time: 03/26/17  9518     History   Chief Complaint Chief Complaint  Patient presents with  . Shortness of Breath  . Cough  . Knee Pain    HPI Beth Potts is a 66 y.o. female.  HPI Patient is a 66 year old female presents the emergency department with ongoing cough and congestion and "wheezing".  She states she is been on steroids and antibiotics as well as bronchodilators without improvement in her symptoms.  She just completed a course of Levaquin yet continues to feel short of breath when she walks.  No history of DVT or pulmonary embolism but she does report left lower extremity pain and swelling over the past 3 weeks.  No recent surgery or long travel.  No recent trauma or injury to her left lower extremity.  Exertional shortness of breath without orthopnea.  No fevers documented at home.  Cough is productive.   Past Medical History:  Diagnosis Date  . Hyperlipidemia   . Hypertension   . Leukopenia 02/07/2015  . Thyroid disease   . Varicose veins     Patient Active Problem List   Diagnosis Date Noted  . Spider veins of both lower extremities 11/28/2015  . Leukopenia 02/07/2015    Past Surgical History:  Procedure Laterality Date  . JOINT REPLACEMENT Right    knee  . ROTATOR CUFF REPAIR Bilateral     OB History    No data available       Home Medications    Prior to Admission medications   Medication Sig Start Date End Date Taking? Authorizing Provider  albuterol (PROAIR HFA) 108 (90 Base) MCG/ACT inhaler Take 1-2 puffs every 6 (six) hours as needed by mouth for shortness of breath.   Yes [provider]  Ergocalciferol (VITAMIN D2) 2000 units TABS Take 2 capsules daily by mouth.   Yes [provider]  fluticasone (FLONASE) 50 MCG/ACT nasal spray Place 1 spray at bedtime into both nostrils. 02/14/17  Yes [provider]  folic acid  (FOLVITE) 1 MG tablet Take 1 mg by mouth daily.   Yes [provider]  levothyroxine (SYNTHROID, LEVOTHROID) 112 MCG tablet Take 112 mcg by mouth daily before breakfast.   Yes [provider]  LORazepam (ATIVAN) 0.5 MG tablet Take 0.5 mg by mouth at bedtime as needed for anxiety or sleep. Reported on 11/28/2015   Yes [provider]  MAGNESIUM PO Take 1 tablet by mouth daily.   Yes [provider]  meloxicam (MOBIC) 15 MG tablet Take 1 tablet (15 mg total) by mouth daily. 11/25/13  Yes Bronson Ing, DPM  metoprolol succinate (TOPROL XL) 25 MG 24 hr tablet Take 25 mg 2 (two) times daily by mouth.   Yes [provider]  montelukast (SINGULAIR) 10 MG tablet Take 10 mg by mouth at bedtime.    Yes [provider]  olmesartan (BENICAR) 20 MG tablet Take 10 mg by mouth daily.    Yes [provider]  omeprazole (PRILOSEC) 20 MG capsule Take 20 mg daily by mouth.   Yes [provider]  TURMERIC PO Take 1 Dose daily by mouth.   Yes [provider]  amitriptyline (ELAVIL) 25 MG tablet Take 1 tablet (25 mg total) by mouth at bedtime. Patient not taking: Reported on 03/26/2017 04/28/14   Bronson Ing, DPM  methocarbamol (ROBAXIN) 500 MG tablet Take 1 tablet (500  mg total) by mouth 2 (two) times daily as needed for muscle spasms. Patient not taking: Reported on 03/26/2017 02/10/15   Waynetta Pean, PA-C    Family History Family History  Problem Relation Age of Onset  . Heart disease Mother   . Stroke Mother   . Heart disease Father     Social History Social History   Tobacco Use  . Smoking status: Never Smoker  . Smokeless tobacco: Never Used  Substance Use Topics  . Alcohol use: No    Alcohol/week: 0.0 oz  . Drug use: No     Allergies   Patient has no known allergies.   Review of Systems Review of Systems  All other systems reviewed and are negative.    Physical Exam Updated Vital Signs BP (!)  177/83   Pulse 66   Temp 98.1 F (36.7 C) (Oral)   Resp 16   Ht 5\' 6"  (1.676 m)   Wt 104.3 kg (230 lb)   SpO2 100%   BMI 37.12 kg/m   Physical Exam  Constitutional: She is oriented to person, place, and time. She appears well-developed and well-nourished. No distress.  HENT:  Head: Normocephalic and atraumatic.  Eyes: EOM are normal.  Neck: Normal range of motion.  Cardiovascular: Normal rate, regular rhythm and normal heart sounds.  Pulmonary/Chest: Effort normal and breath sounds normal.  Abdominal: Soft. She exhibits no distension. There is no tenderness.  Musculoskeletal: Normal range of motion.  Neurological: She is alert and oriented to person, place, and time.  Skin: Skin is warm and dry.  Psychiatric: She has a normal mood and affect. Judgment normal.  Nursing note and vitals reviewed.    ED Treatments / Results  Labs (all labs ordered are listed, but only abnormal results are displayed) Labs Reviewed  CBC  COMPREHENSIVE METABOLIC PANEL    EKG  EKG Interpretation  Date/Time:  Tuesday March 26 2017 05:46:53 EST Ventricular Rate:  72 PR Interval:    QRS Duration: 96 QT Interval:  425 QTC Calculation: 466 R Axis:   53 Text Interpretation:  Sinus rhythm Ventricular premature complex Minimal ST depression When compared with ECG of 05/07/2010, No significant change was found Confirmed by Delora Fuel [id number]) on 03/26/2017 6:00:54 AM Also confirmed by Delora Fuel ([id number]), editor Laurena Spies [id number])  on 03/26/2017 8:46:21 AM       Radiology Dg Chest 2 View  Result Date: 03/26/2017 CLINICAL DATA:  Three weeks of cough, chest congestion, and shortness of breath. Recently diagnosed with bronchitis. Nonsmoker. EXAM: CHEST  2 VIEW COMPARISON:  Chest x-ray of August 22, 2012 FINDINGS: The lungs are mildly hypoinflated. There is increased density in the retrocardiac region likely on the left. The cardiac silhouette is enlarged. The pulmonary vascularity is mildly  engorged. There is no pleural effusion. There is calcification in the wall of the aortic arch. There is mild multilevel degenerative disc disease of the thoracic spine. IMPRESSION: Hypoinflation limits the sensitivity of the study. However density in the left lower lobe is consistent with pneumonia. In addition there is likely superimposed mild CHF. Followup PA and lateral chest X-ray is recommended in 3-4 weeks following trial of antibiotic therapy to ensure resolution and exclude underlying malignancy. Thoracic aortic atherosclerosis. Electronically Signed   By: David  Martinique M.D.   On: 03/26/2017 07:31   Ct Angio Chest Pe W And/or Wo Contrast  Result Date: 03/26/2017 CLINICAL DATA:  Shortness of breath and productive cough for several weeks, history of long training,  initial encounter EXAM: CT ANGIOGRAPHY CHEST WITH CONTRAST TECHNIQUE: Multidetector CT imaging of the chest was performed using the standard protocol during bolus administration of intravenous contrast. Multiplanar CT image reconstructions and MIPs were obtained to evaluate the vascular anatomy. CONTRAST:  100 mL Isovue 370. COMPARISON:  Plain film from earlier in the same day. FINDINGS: Cardiovascular: Mild atherosclerotic calcifications are noted in the thoracic aorta. No aneurysmal dilatation or dissection is identified. No significant coronary calcifications are noted. Mild cardiac enlargement is seen. The pulmonary artery shows a normal branching pattern. A few tiny emboli are noted within branches of the left lower lobe pulmonary artery. No large central pulmonary embolus is seen. Mediastinum/Nodes: Thoracic inlet is within normal limits. No sizable hilar or mediastinal adenopathy is noted. The esophagus as visualized shows evidence of a small sliding-type hiatal hernia. No other focal abnormality is seen. Lungs/Pleura: Mild emphysematous changes are noted. Mild dependent atelectatic changes are seen. No sizable parenchymal nodule is seen.  No focal infiltrate is noted. Upper Abdomen: Visualized upper abdomen shows a tiny hypodensity in the dome of the right lobe of the liver likely representing a small cyst. Musculoskeletal: Degenerative changes of the thoracic spine are noted. No other acute bony abnormality is noted. Review of the MIP images confirms the above findings. IMPRESSION: Tiny left lower lobe pulmonary embolus. No large central pulmonary embolism is seen. Mild dependent changes and emphysematous changes. Small cyst in the dome of the right lobe of the liver. Aortic Atherosclerosis (ICD10-I70.0) and Emphysema (ICD10-J43.9). Electronically Signed   By: Inez Catalina M.D.   On: 03/26/2017 11:46   Dg Knee Complete 4 Views Left  Result Date: 03/26/2017 CLINICAL DATA:  Chronic left knee pain worsening over the past 2-3 weeks with no known trauma. EXAM: LEFT KNEE - COMPLETE 4+ VIEW COMPARISON:  None in PACs FINDINGS: The bones are subjectively adequately mineralized. There is beaking of the tibial spines. Spurs arise from the articular margins of the femoral condyles and tibial plateaus. There is spurring of the articular margins of the patella. There is mild narrowing of the medial joint space. There is no acute or healing fracture. There is no joint effusion. 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 No acute bony abnormality. Electronically Signed   By: David  Martinique M.D.   On: 03/26/2017 07:32    Procedures .Critical Care Performed by: Jola Schmidt, MD Authorized by: Jola Schmidt, MD     Total critical care time: 35 minutes Critical care time was exclusive of separately billable procedures and treating other patients. Critical care was necessary to treat or prevent imminent or life-threatening deterioration. Critical care was time spent personally by me on the following activities: development of treatment plan with patient and/or  surrogate as well as nursing, discussions with consultants, evaluation of patient's response to treatment, examination of patient, obtaining history from patient or surrogate, ordering and performing treatments and interventions, ordering and review of laboratory studies, ordering and review of radiographic studies, pulse oximetry and re-evaluation of patient's condition.   Medications Ordered in ED Medications  iopamidol (ISOVUE-370) 76 % injection (not administered)  albuterol (PROVENTIL) (2.5 MG/3ML) 0.083% nebulizer solution 5 mg (5 mg Nebulization Given 03/26/17 0919)  ipratropium (ATROVENT) nebulizer solution 0.5 mg (0.5 mg Nebulization Given 03/26/17 0919)  iopamidol (ISOVUE-370) 76 % injection 100 mL (100 mLs Intravenous Contrast Given 03/26/17 1103)     Initial Impression / Assessment and Plan / ED Course  I have  reviewed the triage vital signs and the nursing notes.  Pertinent labs & imaging results that were available during my care of the patient were reviewed by me and considered in my medical decision making (see chart for details).     Concern for pneumonia given infiltrate on chest x-ray.  Will likely benefit from admission in the hospital for IV antibiotics given failed outpatient treatment given ongoing symptoms.  CT scan to better define the pneumonia and rule out pulmonary embolism with infarct.  Left lower extremity duplex to evaluate for DVT.  12:42 PM No evidence of pneumonia.  Patient does have left lower lobe pulmonary embolism.  She will be admitted the hospital for IV heparin at this time.  Patient updated.  Final Clinical Impressions(s) / ED Diagnoses   Final diagnoses:  Other acute pulmonary embolism without acute cor pulmonale [facility])    ED Discharge Orders    None       Jola Schmidt, MD 03/26/17 1242

## 2017-03-26 NOTE — H&P (Signed)
History and Physical  Beth Potts QJF:[id number] DOB: 05-03-1951 DOA: 03/26/2017  Referring physician: EDP PCP: Antony Blackbird, MD (Inactive)   Chief Complaint: sob, wheezing  HPI: Beth Potts is a 66 y.o. female   History of hypertension, hypothyroidism, chronic sinusitis, presented to emergency room with above complaints. Patient reports traveled to [place] on the train for a funeral a few weeks ago.  She  started to have left lower extremity pain and edema after come back, she later developed sob. She also reports sinus congestion, productive cough and wheezing for the last few weeks. She was seen at urgent care was treated for bronchitis, she has been on abx for bronchitis and teeth infection recently. She denies fever, no chest pain, reports chest tightness. Denies syncope.  ED course, vital signs stable, cxr unremarkable, cta   Review of Systems:  Detail per HPI, Review of systems are otherwise negative  Past Medical History:  Diagnosis Date  . Hyperlipidemia   . Hypertension   . Leukopenia 02/07/2015  . Thyroid disease   . Varicose veins    Past Surgical History:  Procedure Laterality Date  . JOINT REPLACEMENT Right    knee  . ROTATOR CUFF REPAIR Bilateral    Social History:  reports that  has never smoked. she has never used smokeless tobacco. She reports that she does not drink alcohol or use drugs. Patient lives at & is able to participate in activities of daily living independently , works as a Training and development officer at [employer]  No Known Allergies  Family History  Problem Relation Age of Onset  . Heart disease Mother   . Stroke Mother   . Heart disease Father       Prior to Admission medications   Medication Sig Start Date End Date Taking? Authorizing Provider  albuterol (PROAIR HFA) 108 (90 Base) MCG/ACT inhaler Take 1-2 puffs every 6 (six) hours as needed by mouth for shortness of breath.   Yes [provider]  Ergocalciferol (VITAMIN D2)  2000 units TABS Take 2 capsules daily by mouth.   Yes [provider]  fluticasone (FLONASE) 50 MCG/ACT nasal spray Place 1 spray at bedtime into both nostrils. 02/14/17  Yes [provider]  folic acid (FOLVITE) 1 MG tablet Take 1 mg by mouth daily.   Yes [provider]  levothyroxine (SYNTHROID, LEVOTHROID) 112 MCG tablet Take 112 mcg by mouth daily before breakfast.   Yes [provider]  LORazepam (ATIVAN) 0.5 MG tablet Take 0.5 mg by mouth at bedtime as needed for anxiety or sleep. Reported on 11/28/2015   Yes [provider]  MAGNESIUM PO Take 1 tablet by mouth daily.   Yes [provider]  meloxicam (MOBIC) 15 MG tablet Take 1 tablet (15 mg total) by mouth daily. 11/25/13  Yes Bronson Ing, DPM  metoprolol succinate (TOPROL XL) 25 MG 24 hr tablet Take 25 mg 2 (two) times daily by mouth.   Yes [provider]  montelukast (SINGULAIR) 10 MG tablet Take 10 mg by mouth at bedtime.    Yes [provider]  olmesartan (BENICAR) 20 MG tablet Take 10 mg by mouth daily.    Yes [provider]  omeprazole (PRILOSEC) 20 MG capsule Take 20 mg daily by mouth.   Yes [provider]  TURMERIC PO Take 1 Dose daily by mouth.   Yes [provider]  amitriptyline (ELAVIL) 25 MG tablet Take 1 tablet (25 mg total) by mouth at bedtime. Patient  not taking: Reported on 03/26/2017 04/28/14   Bronson Ing, DPM  methocarbamol (ROBAXIN) 500 MG tablet Take 1 tablet (500 mg total) by mouth 2 (two) times daily as needed for muscle spasms. Patient not taking: Reported on 03/26/2017 02/10/15   Waynetta Pean, PA-C    Physical Exam: BP (!) 154/69 (BP Location: Left Arm)   Pulse 69   Temp 98.1 F (36.7 C) (Oral)   Resp 16   Ht 5\' 6"  (1.676 m)   Wt 104.3 kg (230 lb)   SpO2 97%   BMI 37.12 kg/m   General:  NAD Eyes: PERRL ENT: unremarkable Neck: supple, no JVD Cardiovascular: RRR Respiratory: CTABL Abdomen:  soft/ND/ND, positive bowel sounds Skin: no rash Musculoskeletal:  No edema Psychiatric: calm/cooperative Neurologic: no focal findings            Labs on Admission:  Basic Metabolic Panel: Recent Labs  Lab 03/26/17 0943  NA 140  K 3.7  CL 105  CO2 27  GLUCOSE 93  BUN 13  CREATININE 1.02*  CALCIUM 8.9   Liver Function Tests: Recent Labs  Lab 03/26/17 0943  AST 23  ALT 19  ALKPHOS 79  BILITOT 0.5  PROT 7.0  ALBUMIN 3.8   No results for input(s): LIPASE, AMYLASE in the last 168 hours. No results for input(s): AMMONIA in the last 168 hours. CBC: Recent Labs  Lab 03/26/17 0943  WBC 4.7  HGB 12.2  HCT 37.4  MCV 86.0  PLT 155   Cardiac Enzymes: No results for input(s): CKTOTAL, CKMB, CKMBINDEX, TROPONINI in the last 168 hours.  BNP (last 3 results) No results for input(s): BNP in the last 8760 hours.  ProBNP (last 3 results) No results for input(s): PROBNP in the last 8760 hours.  CBG: No results for input(s): GLUCAP in the last 168 hours.  Radiological Exams on Admission: Dg Chest 2 View  Result Date: 03/26/2017 CLINICAL DATA:  Three weeks of cough, chest congestion, and shortness of breath. Recently diagnosed with bronchitis. Nonsmoker. EXAM: CHEST  2 VIEW COMPARISON:  Chest x-ray of August 22, 2012 FINDINGS: The lungs are mildly hypoinflated. There is increased density in the retrocardiac region likely on the left. The cardiac silhouette is enlarged. The pulmonary vascularity is mildly engorged. There is no pleural effusion. There is calcification in the wall of the aortic arch. There is mild multilevel degenerative disc disease of the thoracic spine. IMPRESSION: Hypoinflation limits the sensitivity of the study. However density in the left lower lobe is consistent with pneumonia. In addition there is likely superimposed mild CHF. Followup PA and lateral chest X-ray is recommended in 3-4 weeks following trial of antibiotic therapy to ensure resolution and exclude  underlying malignancy. Thoracic aortic atherosclerosis. Electronically Signed   By: David  Martinique M.D.   On: 03/26/2017 07:31   Ct Angio Chest Pe W And/or Wo Contrast  Result Date: 03/26/2017 CLINICAL DATA:  Shortness of breath and productive cough for several weeks, history of long training, initial encounter EXAM: CT ANGIOGRAPHY CHEST WITH CONTRAST TECHNIQUE: Multidetector CT imaging of the chest was performed using the standard protocol during bolus administration of intravenous contrast. Multiplanar CT image reconstructions and MIPs were obtained to evaluate the vascular anatomy. CONTRAST:  100 mL Isovue 370. COMPARISON:  Plain film from earlier in the same day. FINDINGS: Cardiovascular: Mild atherosclerotic calcifications are noted in the thoracic aorta. No aneurysmal dilatation or dissection is identified. No significant coronary calcifications are noted. Mild cardiac enlargement is seen. The pulmonary artery  shows a normal branching pattern. A few tiny emboli are noted within branches of the left lower lobe pulmonary artery. No large central pulmonary embolus is seen. Mediastinum/Nodes: Thoracic inlet is within normal limits. No sizable hilar or mediastinal adenopathy is noted. The esophagus as visualized shows evidence of a small sliding-type hiatal hernia. No other focal abnormality is seen. Lungs/Pleura: Mild emphysematous changes are noted. Mild dependent atelectatic changes are seen. No sizable parenchymal nodule is seen. No focal infiltrate is noted. Upper Abdomen: Visualized upper abdomen shows a tiny hypodensity in the dome of the right lobe of the liver likely representing a small cyst. Musculoskeletal: Degenerative changes of the thoracic spine are noted. No other acute bony abnormality is noted. Review of the MIP images confirms the above findings. IMPRESSION: Tiny left lower lobe pulmonary embolus. No large central pulmonary embolism is seen. Mild dependent changes and emphysematous changes.  Small cyst in the dome of the right lobe of the liver. Aortic Atherosclerosis (ICD10-I70.0) and Emphysema (ICD10-J43.9). Electronically Signed   By: Inez Catalina M.D.   On: 03/26/2017 11:46   Dg Knee Complete 4 Views Left  Result Date: 03/26/2017 CLINICAL DATA:  Chronic left knee pain worsening over the past 2-3 weeks with no known trauma. EXAM: LEFT KNEE - COMPLETE 4+ VIEW COMPARISON:  None in PACs FINDINGS: The bones are subjectively adequately mineralized. There is beaking of the tibial spines. Spurs arise from the articular margins of the femoral condyles and tibial plateaus. There is spurring of the articular margins of the patella. There is mild narrowing of the medial joint space. There is no acute or healing fracture. There is no joint effusion. 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 No acute bony abnormality. Electronically Signed   By: David  Martinique M.D.   On: 03/26/2017 07:32    EKG: Independently reviewed. *  Assessment/Plan Present on Admission: **None**  PE Possible provoked from recent trip , she is a nonsmoker, no personal history or family h/o clotting disorders or malignancy.  Hemodynamically stable, no hypoxia, she does reports dyspnea Continue heparin drip, echo pending Transition or DOAC once dyspnea improves.   Left lower extremity pain:  Venous US pending to r/o DVT.  Wheezing/bronchitis/sinusitis/GERD: No significant wheezing on exam, continue singular, flonase, prn albuterol. Add mucinex, Start ppi. Advised patient to follow with PMD/ENT/GI if symptom persists.  HTN: continue betablocker. Hold benicar  CKDIII, cr close to baseline, renal dosing meds.  hypothryoidism Synthroid  DVT prophylaxis: on heparin drip  Consultants: none  Code Status: full   Family Communication:  Patient   Disposition Plan: med tele obs  Time spent: 7mins  Makaya Juneau MD,  PhD [facility] Pager [phone] If 7PM-7AM, please contact night-coverage at [email or web address], password [facility]

## 2017-03-26 NOTE — Progress Notes (Addendum)
LLE venous duplex prelim: negative for DVT. Ardyn Forge Eunice, RDMS, RVT  

## 2017-03-26 NOTE — ED Triage Notes (Addendum)
Pt c/o SOB, productive cough, and chest tightness x 3 week and L knee pain and swelling x 3 weeks.  Pain score 8/10.  Pt reports being seen by Urgent care for same and diagnosed w/ bronchitis.  Sts she has taken several antibiotics w/o relief.  Denies injury to knee.     Pt easily speaking full sentences and very chatty during triage.

## 2017-03-27 DIAGNOSIS — Z96651 Presence of right artificial knee joint: Secondary | ICD-10-CM | POA: Diagnosis present

## 2017-03-27 DIAGNOSIS — Z8249 Family history of ischemic heart disease and other diseases of the circulatory system: Secondary | ICD-10-CM | POA: Diagnosis not present

## 2017-03-27 DIAGNOSIS — R0602 Shortness of breath: Secondary | ICD-10-CM | POA: Diagnosis present

## 2017-03-27 DIAGNOSIS — I129 Hypertensive chronic kidney disease with stage 1 through stage 4 chronic kidney disease, or unspecified chronic kidney disease: Secondary | ICD-10-CM | POA: Diagnosis present

## 2017-03-27 DIAGNOSIS — K219 Gastro-esophageal reflux disease without esophagitis: Secondary | ICD-10-CM | POA: Diagnosis present

## 2017-03-27 DIAGNOSIS — Z79890 Hormone replacement therapy (postmenopausal): Secondary | ICD-10-CM | POA: Diagnosis not present

## 2017-03-27 DIAGNOSIS — N183 Chronic kidney disease, stage 3 (moderate): Secondary | ICD-10-CM | POA: Diagnosis present

## 2017-03-27 DIAGNOSIS — J329 Chronic sinusitis, unspecified: Secondary | ICD-10-CM | POA: Diagnosis present

## 2017-03-27 DIAGNOSIS — I2699 Other pulmonary embolism without acute cor pulmonale: Secondary | ICD-10-CM | POA: Diagnosis not present

## 2017-03-27 DIAGNOSIS — J45909 Unspecified asthma, uncomplicated: Secondary | ICD-10-CM | POA: Diagnosis present

## 2017-03-27 DIAGNOSIS — Z823 Family history of stroke: Secondary | ICD-10-CM | POA: Diagnosis not present

## 2017-03-27 DIAGNOSIS — Z791 Long term (current) use of non-steroidal anti-inflammatories (NSAID): Secondary | ICD-10-CM | POA: Diagnosis not present

## 2017-03-27 DIAGNOSIS — F419 Anxiety disorder, unspecified: Secondary | ICD-10-CM | POA: Diagnosis present

## 2017-03-27 DIAGNOSIS — E039 Hypothyroidism, unspecified: Secondary | ICD-10-CM | POA: Diagnosis present

## 2017-03-27 DIAGNOSIS — Z7951 Long term (current) use of inhaled steroids: Secondary | ICD-10-CM | POA: Diagnosis not present

## 2017-03-27 LAB — CBC
HEMATOCRIT: 37.4 % (ref 36.0–46.0)
HEMOGLOBIN: 12 g/dL (ref 12.0–15.0)
MCH: 27.5 pg (ref 26.0–34.0)
MCHC: 32.1 g/dL (ref 30.0–36.0)
MCV: 85.8 fL (ref 78.0–100.0)
Platelets: 166 10*3/uL (ref 150–400)
RBC: 4.36 MIL/uL (ref 3.87–5.11)
RDW: 14.4 % (ref 11.5–15.5)
WBC: 4.6 10*3/uL (ref 4.0–10.5)

## 2017-03-27 LAB — BASIC METABOLIC PANEL
ANION GAP: 8 (ref 5–15)
BUN: 11 mg/dL (ref 6–20)
CHLORIDE: 108 mmol/L (ref 101–111)
CO2: 25 mmol/L (ref 22–32)
Calcium: 9 mg/dL (ref 8.9–10.3)
Creatinine, Ser: 0.92 mg/dL (ref 0.44–1.00)
GFR calc Af Amer: >60 mL/min (ref >60)
GFR calc non Af Amer: >60 mL/min (ref >60)
Glucose, Bld: 90 mg/dL (ref 65–99)
POTASSIUM: 4 mmol/L (ref 3.5–5.1)
Sodium: 141 mmol/L (ref 135–145)

## 2017-03-27 LAB — ECHOCARDIOGRAM COMPLETE
Height: 66 in
Weight: 3693.15 oz

## 2017-03-27 LAB — HIV ANTIBODY (ROUTINE TESTING W REFLEX): HIV Screen 4th Generation wRfx: NONREACTIVE

## 2017-03-27 LAB — HEPARIN LEVEL (UNFRACTIONATED): HEPARIN UNFRACTIONATED: 0.9 [IU]/mL — AB (ref 0.30–0.70)

## 2017-03-27 MED ORDER — IRBESARTAN 75 MG PO TABS
37.5000 mg | ORAL_TABLET | Freq: Every day | ORAL | Status: DC
  Administered 2017-03-28: 09:00:00 37.5 mg via ORAL
  Filled 2017-03-27: qty 1

## 2017-03-27 MED ORDER — ACETAMINOPHEN 325 MG PO TABS
650.0000 mg | ORAL_TABLET | ORAL | Status: DC | PRN
  Administered 2017-03-27 (×2): 650 mg via ORAL
  Filled 2017-03-27 (×2): qty 2

## 2017-03-27 MED ORDER — TRAMADOL HCL 50 MG PO TABS: 50.0000 mg | ORAL_TABLET | Freq: Four times a day (QID) | ORAL | Status: DC | PRN

## 2017-03-27 MED ORDER — HEPARIN (PORCINE) IN NACL 100-0.45 UNIT/ML-% IJ SOLN: 1200.0000 [IU]/h | INTRAMUSCULAR | Status: DC

## 2017-03-27 MED ORDER — RIVAROXABAN (XARELTO) EDUCATION KIT FOR DVT/PE PATIENTS
PACK | Freq: Once | Status: AC
  Administered 2017-03-27: 16:00:00
  Filled 2017-03-27: qty 1

## 2017-03-27 MED ORDER — RIVAROXABAN 15 MG PO TABS
15.0000 mg | ORAL_TABLET | Freq: Two times a day (BID) | ORAL | Status: DC
  Administered 2017-03-27 – 2017-03-28 (×3): 15 mg via ORAL
  Filled 2017-03-27 (×4): qty 1

## 2017-03-27 MED ORDER — ALBUTEROL SULFATE (2.5 MG/3ML) 0.083% IN NEBU
2.5000 mg | INHALATION_SOLUTION | Freq: Two times a day (BID) | RESPIRATORY_TRACT | Status: DC
  Administered 2017-03-27 – 2017-03-28 (×2): 2.5 mg via RESPIRATORY_TRACT
  Filled 2017-03-27 (×2): qty 3

## 2017-03-27 NOTE — Discharge Instructions (Signed)
Information on my medicine - XARELTO (rivaroxaban)  This medication education was reviewed with me or my healthcare representative as part of my discharge preparation.    WHY WAS XARELTO PRESCRIBED FOR YOU? Xarelto was prescribed to treat blood clots that may have been found in the veins of your legs (deep vein thrombosis) or in your lungs (pulmonary embolism) and to reduce the risk of them occurring again.  What do you need to know about Xarelto? The starting dose is one 15 mg tablet taken TWICE daily with food for the FIRST 21 DAYS then on 04/18/17  the dose is changed to one 20 mg tablet taken ONCE A DAY with your evening meal.  DO NOT stop taking Xarelto without talking to the health care provider who prescribed the medication.  Refill your prescription for 20 mg tablets before you run out.  After discharge, you should have regular check-up appointments with your healthcare provider that is prescribing your Xarelto.  In the future your dose may need to be changed if your kidney function changes by a significant amount.  What do you do if you miss a dose? If you are taking Xarelto TWICE DAILY and you miss a dose, take it as soon as you remember. You may take two 15 mg tablets (total 30 mg) at the same time then resume your regularly scheduled 15 mg twice daily the next day.  If you are taking Xarelto ONCE DAILY and you miss a dose, take it as soon as you remember on the same day then continue your regularly scheduled once daily regimen the next day. Do not take two doses of Xarelto at the same time.   Important Safety Information Xarelto is a blood thinner medicine that can cause bleeding. You should call your healthcare provider right away if you experience any of the following: ? Bleeding from an injury or your nose that does not stop. ? Unusual colored urine (red or dark brown) or unusual colored stools (red or black). ? Unusual bruising for unknown reasons. ? A serious fall  or if you hit your head (even if there is no bleeding).  Some medicines may interact with Xarelto and might increase your risk of bleeding while on Xarelto. To help avoid this, consult your healthcare provider or pharmacist prior to using any new prescription or non-prescription medications, including herbals, vitamins, non-steroidal anti-inflammatory drugs (NSAIDs) and supplements.  This website has more information on Xarelto: [email or web address]

## 2017-03-27 NOTE — Care Management Note (Signed)
Case Management Note  Patient Details  Name: Beth Potts MRN: [id number] Date of Birth: 15-Apr-1951  Subjective/Objective: 66 y/o f admitted w/PE. From home.                   Action/Plan:d/c plan home.   Expected Discharge Date:  (unknown)               Expected Discharge Plan:  Home/Self Care  In-House Referral:     Discharge planning Services  CM Consult  Post Acute Care Choice:    Choice offered to:     DME Arranged:    DME Agency:     HH Arranged:    HH Agency:     Status of Service:  In process, will continue to follow  If discussed at Long Length of Stay Meetings, dates discussed:    Additional Comments:  Dessa Phi, RN 03/27/2017, 1:05 PM

## 2017-03-27 NOTE — Progress Notes (Signed)
Per pharmacist michelle B, ok to DC heprain drip and give first dose xarelto.

## 2017-03-27 NOTE — Progress Notes (Signed)
ANTICOAGULATION CONSULT NOTE - Follow Up Consult  Pharmacy Consult for heparin Indication: new pulmonary embolus  No Known Allergies  Patient Measurements: Height: 5\' 6"  (167.6 cm) Weight: 230 lb 13.2 oz (104.7 kg) IBW/kg (Calculated) : 59.3 Heparin Dosing Weight: 83.3 kg  Vital Signs: Temp: 97.9 F (36.6 C) (11/07 0521) Temp Source: Oral (11/07 0521) BP: 150/91 (11/07 0521) Pulse Rate: 60 (11/07 0521)  Labs: Recent Labs    03/26/17 0943 03/26/17 1933 03/27/17 0534  HGB 12.2  --  12.0  HCT 37.4  --  37.4  PLT 155  --  166  HEPARINUNFRC  --  0.60 0.90*  CREATININE 1.02*  --  0.92    Estimated Creatinine Clearance: 75.6 mL/min (by C-G formula based on SCr of 0.92 mg/dL).   Assessment: 66 y.o female presenting on 03/26/17 with cough, shortness of breath, and history of lower extremity pain and swelling over the past 3 weeks. Possibly provoked from recent trip. Chest CTA demonstrates small left lower lobe pulmonary embolus. Started on heparin infusion for new PE. Pharmacy consulted for heparin transition to [place].   03/27/2017 - Heparin level supratherapeutic (0.90) on 1400 units/hr. Confirmed infusion running at appropriate rate, level drawn at appropriate time and at appropriate site. Heparin rate adjusted to 1200 units/hr. - Hgb and plt stable.  - No signs of bleeding per patient. - Scr wnl and stable - Note increased risk of bleeding with concurrent meloxicam. Will educate patient on risks associated with continued meloxicam use.   Goal of Therapy:  Heparin level 0.3-0.7 units/ml Monitor platelets by anticoagulation protocol: Yes   Plan:  - Start Xarelto 15 mg twice daily x21 days, followed by 20 mg once daily.  - Stop heparin infusion at the same time first Xarelto dose is given - Monitor CBC and renal function - Monitor signs and symptoms of bleeding or thrombosis - Will educate patient on Xarelto therapy  Richmond Campbell  PharmD Candidate 03/27/2017,9:00 AM

## 2017-03-27 NOTE — Plan of Care (Signed)
Con to mon 

## 2017-03-27 NOTE — Progress Notes (Signed)
PROGRESS NOTE    Beth Potts  PPI:[id number] DOB: May 06, 1951 DOA: 03/26/2017 PCP: System, Pcp Not In    Brief Narrative: Beth Potts is a 66 y.o. female   History of hypertension, hypothyroidism, chronic sinusitis, presented to emergency room with with chest pain and SOB, she was found to have a small PE on the left lower lobe    Assessment & Plan:   Active Problems:   Pulmonary emboli (HCC)   Pulmonary embolism:  Possibly provoked from the trip, she will need hypercoagulable work up once she is done with the course.  Change to xarelto.  Symptom management.  Venous duplex neg.  Echo unremarkable.    Hypertension:  Well controlled.    Hypothyroidism:  Resume synthroid.       DVT prophylaxis: xarelto.  Code Status: full code.  Family Communication: none at bedside, discussed plan with pt.  Disposition Plan: home tomorrow.    Consultants:   None.    Procedures: none.   Antimicrobials: none.   Subjective: Some residual chest pain and sob.   Objective: Vitals:   03/26/17 2028 03/27/17 0521 03/27/17 0939 03/27/17 1336  BP: 137/66 (!) 150/91  134/65  Pulse: 70 60    Resp: 19 18    Temp: 98 F (36.7 C) 97.9 F (36.6 C)  98.1 F (36.7 C)  TempSrc: Oral Oral  Oral  SpO2: 100% 96% 98% 98%  Weight:      Height:        Intake/Output Summary (Last 24 hours) at 03/27/2017 1708 Last data filed at 03/27/2017 0649 Gross per 24 hour  Intake 165.43 ml  Output -  Net 165.43 ml   Filed Weights   03/26/17 0750 03/26/17 1420  Weight: 104.3 kg (230 lb) 104.7 kg (230 lb 13.2 oz)    Examination:  General exam: Appears calm and comfortable  Respiratory system: Clear to auscultation. Respiratory effort normal. Cardiovascular system: S1 & S2 heard, RRR. No JVD, murmurs, rubs, gallops or clicks. No pedal edema. Gastrointestinal system: Abdomen is nondistended, soft and nontender. No organomegaly or masses felt. Normal bowel sounds heard. Central  nervous system: Alert and oriented. No focal neurological deficits. Extremities: Symmetric 5 x 5 power. Skin: No rashes, lesions or ulcers Psychiatry: Judgement and insight appear normal. Mood & affect appropriate.     Data Reviewed: I have personally reviewed following labs and imaging studies  CBC: Recent Labs  Lab 03/26/17 0943 03/27/17 0534  WBC 4.7 4.6  HGB 12.2 12.0  HCT 37.4 37.4  MCV 86.0 85.8  PLT 155 063   Basic Metabolic Panel: Recent Labs  Lab 03/26/17 0943 03/27/17 0534  NA 140 141  K 3.7 4.0  CL 105 108  CO2 27 25  GLUCOSE 93 90  BUN 13 11  CREATININE 1.02* 0.92  CALCIUM 8.9 9.0   GFR: Estimated Creatinine Clearance: 75.6 mL/min (by C-G formula based on SCr of 0.92 mg/dL). Liver Function Tests: Recent Labs  Lab 03/26/17 0943  AST 23  ALT 19  ALKPHOS 79  BILITOT 0.5  PROT 7.0  ALBUMIN 3.8   No results for input(s): LIPASE, AMYLASE in the last 168 hours. No results for input(s): AMMONIA in the last 168 hours. Coagulation Profile: No results for input(s): INR, PROTIME in the last 168 hours. Cardiac Enzymes: No results for input(s): CKTOTAL, CKMB, CKMBINDEX, TROPONINI in the last 168 hours. BNP (last 3 results) No results for input(s): PROBNP in the last 8760 hours. HbA1C: No results for input(s):  HGBA1C in the last 72 hours. CBG: No results for input(s): GLUCAP in the last 168 hours. Lipid Profile: No results for input(s): CHOL, HDL, LDLCALC, TRIG, CHOLHDL, LDLDIRECT in the last 72 hours. Thyroid Function Tests: No results for input(s): TSH, T4TOTAL, FREET4, T3FREE, THYROIDAB in the last 72 hours. Anemia Panel: No results for input(s): VITAMINB12, FOLATE, FERRITIN, TIBC, IRON, RETICCTPCT in the last 72 hours. Sepsis Labs: No results for input(s): PROCALCITON, LATICACIDVEN in the last 168 hours.  No results found for this or any previous visit (from the past 240 hour(s)).       Radiology Studies: Dg Chest 2 View  Result Date:  03/26/2017 CLINICAL DATA:  Three weeks of cough, chest congestion, and shortness of breath. Recently diagnosed with bronchitis. Nonsmoker. EXAM: CHEST  2 VIEW COMPARISON:  Chest x-ray of August 22, 2012 FINDINGS: The lungs are mildly hypoinflated. There is increased density in the retrocardiac region likely on the left. The cardiac silhouette is enlarged. The pulmonary vascularity is mildly engorged. There is no pleural effusion. There is calcification in the wall of the aortic arch. There is mild multilevel degenerative disc disease of the thoracic spine. IMPRESSION: Hypoinflation limits the sensitivity of the study. However density in the left lower lobe is consistent with pneumonia. In addition there is likely superimposed mild CHF. Followup PA and lateral chest X-ray is recommended in 3-4 weeks following trial of antibiotic therapy to ensure resolution and exclude underlying malignancy. Thoracic aortic atherosclerosis. Electronically Signed   By: David  Martinique M.D.   On: 03/26/2017 07:31   Ct Angio Chest Pe W And/or Wo Contrast  Result Date: 03/26/2017 CLINICAL DATA:  Shortness of breath and productive cough for several weeks, history of long training, initial encounter EXAM: CT ANGIOGRAPHY CHEST WITH CONTRAST TECHNIQUE: Multidetector CT imaging of the chest was performed using the standard protocol during bolus administration of intravenous contrast. Multiplanar CT image reconstructions and MIPs were obtained to evaluate the vascular anatomy. CONTRAST:  100 mL Isovue 370. COMPARISON:  Plain film from earlier in the same day. FINDINGS: Cardiovascular: Mild atherosclerotic calcifications are noted in the thoracic aorta. No aneurysmal dilatation or dissection is identified. No significant coronary calcifications are noted. Mild cardiac enlargement is seen. The pulmonary artery shows a normal branching pattern. A few tiny emboli are noted within branches of the left lower lobe pulmonary artery. No large central  pulmonary embolus is seen. Mediastinum/Nodes: Thoracic inlet is within normal limits. No sizable hilar or mediastinal adenopathy is noted. The esophagus as visualized shows evidence of a small sliding-type hiatal hernia. No other focal abnormality is seen. Lungs/Pleura: Mild emphysematous changes are noted. Mild dependent atelectatic changes are seen. No sizable parenchymal nodule is seen. No focal infiltrate is noted. Upper Abdomen: Visualized upper abdomen shows a tiny hypodensity in the dome of the right lobe of the liver likely representing a small cyst. Musculoskeletal: Degenerative changes of the thoracic spine are noted. No other acute bony abnormality is noted. Review of the MIP images confirms the above findings. IMPRESSION: Tiny left lower lobe pulmonary embolus. No large central pulmonary embolism is seen. Mild dependent changes and emphysematous changes. Small cyst in the dome of the right lobe of the liver. Aortic Atherosclerosis (ICD10-I70.0) and Emphysema (ICD10-J43.9). Electronically Signed   By: Inez Catalina M.D.   On: 03/26/2017 11:46   Dg Knee Complete 4 Views Left  Result Date: 03/26/2017 CLINICAL DATA:  Chronic left knee pain worsening over the past 2-3 weeks with no known trauma. EXAM: LEFT  KNEE - COMPLETE 4+ VIEW COMPARISON:  None in PACs FINDINGS: The bones are subjectively adequately mineralized. There is beaking of the tibial spines. Spurs arise from the articular margins of the femoral condyles and tibial plateaus. There is spurring of the articular margins of the patella. There is mild narrowing of the medial joint space. There is no acute or healing fracture. There is no joint effusion. 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 No acute bony abnormality. Electronically Signed   By: David  Martinique M.D.   On: 03/26/2017 07:32        Scheduled Meds: . albuterol  2.5 mg Inhalation BID  .  fluticasone  2 spray Each Nare Daily  . folic acid  1 mg Oral Daily  . guaiFENesin  600 mg Oral BID  . [START ON 03/28/2017] irbesartan  37.5 mg Oral Daily  . levothyroxine  112 mcg Oral QAC breakfast  . meloxicam  15 mg Oral Daily  . metoprolol succinate  25 mg Oral BID  . montelukast  10 mg Oral QHS  . rivaroxaban  15 mg Oral BID WC   Continuous Infusions:   LOS: 0 days    Time spent: 4 minutes    Cai Flott, MD [facility] Pager [phone] If 7PM-7AM, please contact night-coverage [email or web address] Password [email or web address] 03/27/2017, 5:08 PM

## 2017-03-28 LAB — CBC
HEMATOCRIT: 36.7 % (ref 36.0–46.0)
HEMOGLOBIN: 11.7 g/dL — AB (ref 12.0–15.0)
MCH: 27.3 pg (ref 26.0–34.0)
MCHC: 31.9 g/dL (ref 30.0–36.0)
MCV: 85.7 fL (ref 78.0–100.0)
Platelets: 166 10*3/uL (ref 150–400)
RBC: 4.28 MIL/uL (ref 3.87–5.11)
RDW: 14.5 % (ref 11.5–15.5)
WBC: 3.9 10*3/uL — AB (ref 4.0–10.5)

## 2017-03-28 MED ORDER — ALBUTEROL SULFATE (2.5 MG/3ML) 0.083% IN NEBU: 2.5000 mg | INHALATION_SOLUTION | Freq: Two times a day (BID) | RESPIRATORY_TRACT | 12 refills | Status: AC

## 2017-03-28 MED ORDER — RIVAROXABAN 20 MG PO TABS: 20.0000 mg | ORAL_TABLET | Freq: Every day | ORAL | 1 refills | Status: AC

## 2017-03-28 MED ORDER — TRAMADOL HCL 50 MG PO TABS: 50.0000 mg | ORAL_TABLET | Freq: Four times a day (QID) | ORAL | 0 refills | Status: AC | PRN

## 2017-03-28 MED ORDER — RIVAROXABAN 15 MG PO TABS: 15.0000 mg | ORAL_TABLET | Freq: Two times a day (BID) | ORAL | 0 refills | Status: AC

## 2017-03-28 NOTE — Progress Notes (Addendum)
Contacted by nurse requesting nebulizer for patient for home use, requested order. Contacted [facility] to deliver to room. [facility] called to say nebulizer is not covered with them by [employer], out of pocket expense would be about $45, patient will pay out of pocket cost and pick up at the [employer]. [phone]

## 2017-03-28 NOTE — Progress Notes (Signed)
Report received from [facility], pt received to room 1621 via [place] and ambulated to bed. Call bell in reach. Pt in NAD. Instructed to call for assist as needed, pt verbalized understanding.

## 2017-03-28 NOTE — Progress Notes (Signed)
03/28/17  1321  Reviewed discharge instructions with patient. Patient verbalized understanding of discharge instructions. Copy of discharge instructions and prescriptions given to patient.

## 2017-03-28 NOTE — Progress Notes (Signed)
Patient transferred to room 1621 at this time in no acute episode.

## 2017-04-01 NOTE — Discharge Summary (Signed)
Physician Discharge Summary  Beth Potts BDZ:[id number] DOB: Jun 18, 1950 DOA: 03/26/2017  PCP: System, Pcp Not In  Admit date: 03/26/2017 Discharge date: 03/28/2017  Admitted From: [facility].  Disposition:  Home.   Recommendations for Outpatient Follow-up:  1. Follow up with PCP in 1-2 weeks 2. Please obtain BMP/CBC in one week   Discharge Condition:stable.  CODE STATUS: full code.  Diet recommendation: Heart Healthy   Brief/Interim Summary: Beth Potts a 66 y.o.femaleHistory of hypertension, hypothyroidism, chronic sinusitis, presented to emergency room with with chest pain and SOB, she was found to have a small PE on the left lower lobe     Discharge Diagnoses:  Active Problems:   Pulmonary emboli (HCC)  Pulmonary embolism:  Possibly provoked from the trip, she will need hypercoagulable work up once she is done with the course.  Change to xarelto on discharge.  Symptom management.  Venous duplex neg.  Echo unremarkable.    Hypertension:  Well controlled.    Hypothyroidism:  Resume synthroid.   Asthma with bronchitis:  Will need neb and albuterol as needed.      Discharge Instructions  Discharge Instructions    Diet - low sodium heart healthy   Complete by:  As directed    Discharge instructions   Complete by:  As directed    Follow up with PCP in one to two weeks, please follow up with cbc in one week.     Allergies as of 03/28/2017   No Known Allergies     Medication List    TAKE these medications   amitriptyline 25 MG tablet Commonly known as:  ELAVIL Take 1 tablet (25 mg total) by mouth at bedtime.   fluticasone 50 MCG/ACT nasal spray Commonly known as:  FLONASE Place 1 spray at bedtime into both nostrils.   folic acid 1 MG tablet Commonly known as:  FOLVITE Take 1 mg by mouth daily.   levothyroxine 112 MCG tablet Commonly known as:  SYNTHROID, LEVOTHROID Take 112 mcg by mouth daily before breakfast.   LORazepam 0.5 MG  tablet Commonly known as:  ATIVAN Take 0.5 mg by mouth at bedtime as needed for anxiety or sleep. Reported on 11/28/2015   MAGNESIUM PO Take 1 tablet by mouth daily.   meloxicam 15 MG tablet Commonly known as:  MOBIC Take 1 tablet (15 mg total) by mouth daily.   methocarbamol 500 MG tablet Commonly known as:  ROBAXIN Take 1 tablet (500 mg total) by mouth 2 (two) times daily as needed for muscle spasms.   montelukast 10 MG tablet Commonly known as:  SINGULAIR Take 10 mg by mouth at bedtime.   olmesartan 20 MG tablet Commonly known as:  BENICAR Take 10 mg by mouth daily.   omeprazole 20 MG capsule Commonly known as:  PRILOSEC Take 20 mg daily by mouth.   PROAIR HFA 108 (90 Base) MCG/ACT inhaler Generic drug:  albuterol Take 1-2 puffs every 6 (six) hours as needed by mouth for shortness of breath. What changed:  Another medication with the same name was added. Make sure you understand how and when to take each.   albuterol (2.5 MG/3ML) 0.083% nebulizer solution Commonly known as:  PROVENTIL Inhale 3 mLs (2.5 mg total) 2 (two) times daily into the lungs. What changed:  You were already taking a medication with the same name, and this prescription was added. Make sure you understand how and when to take each.   Rivaroxaban 15 MG Tabs tablet Commonly known as:  XARELTO Take 1 tablet (15 mg total) 2 (two) times daily with a meal for 21 days by mouth.   rivaroxaban 20 MG Tabs tablet Commonly known as:  XARELTO Take 1 tablet (20 mg total) daily with supper by mouth. Start taking on:  04/19/2017   TOPROL XL 25 MG 24 hr tablet Generic drug:  metoprolol succinate Take 25 mg 2 (two) times daily by mouth.   traMADol 50 MG tablet Commonly known as:  ULTRAM Take 1 tablet (50 mg total) every 6 (six) hours as needed by mouth for moderate pain.   TURMERIC PO Take 1 Dose daily by mouth.   Vitamin D2 2000 units Tabs Take 2 capsules daily by mouth.      Follow-up Information     Primary care physician. Schedule an appointment as soon as possible for a visit in 1 week(s).        [place] Follow up.   Why:  nebulizer Contact information: [address] [place] [address] [phone]          No Known Allergies  Consultations:  None.    Procedures/Studies: Dg Chest 2 View  Result Date: 03/26/2017 CLINICAL DATA:  Three weeks of cough, chest congestion, and shortness of breath. Recently diagnosed with bronchitis. Nonsmoker. EXAM: CHEST  2 VIEW COMPARISON:  Chest x-ray of August 22, 2012 FINDINGS: The lungs are mildly hypoinflated. There is increased density in the retrocardiac region likely on the left. The cardiac silhouette is enlarged. The pulmonary vascularity is mildly engorged. There is no pleural effusion. There is calcification in the wall of the aortic arch. There is mild multilevel degenerative disc disease of the thoracic spine. IMPRESSION: Hypoinflation limits the sensitivity of the study. However density in the left lower lobe is consistent with pneumonia. In addition there is likely superimposed mild CHF. Followup PA and lateral chest X-ray is recommended in 3-4 weeks following trial of antibiotic therapy to ensure resolution and exclude underlying malignancy. Thoracic aortic atherosclerosis. Electronically Signed   By: David  Martinique M.D.   On: 03/26/2017 07:31   Ct Angio Chest Pe W And/or Wo Contrast  Result Date: 03/26/2017 CLINICAL DATA:  Shortness of breath and productive cough for several weeks, history of long training, initial encounter EXAM: CT ANGIOGRAPHY CHEST WITH CONTRAST TECHNIQUE: Multidetector CT imaging of the chest was performed using the standard protocol during bolus administration of intravenous contrast. Multiplanar CT image reconstructions and MIPs were obtained to evaluate the vascular anatomy. CONTRAST:  100 mL Isovue 370. COMPARISON:  Plain film from earlier in the same day. FINDINGS: Cardiovascular:  Mild atherosclerotic calcifications are noted in the thoracic aorta. No aneurysmal dilatation or dissection is identified. No significant coronary calcifications are noted. Mild cardiac enlargement is seen. The pulmonary artery shows a normal branching pattern. A few tiny emboli are noted within branches of the left lower lobe pulmonary artery. No large central pulmonary embolus is seen. Mediastinum/Nodes: Thoracic inlet is within normal limits. No sizable hilar or mediastinal adenopathy is noted. The esophagus as visualized shows evidence of a small sliding-type hiatal hernia. No other focal abnormality is seen. Lungs/Pleura: Mild emphysematous changes are noted. Mild dependent atelectatic changes are seen. No sizable parenchymal nodule is seen. No focal infiltrate is noted. Upper Abdomen: Visualized upper abdomen shows a tiny hypodensity in the dome of the right lobe of the liver likely representing a small cyst. Musculoskeletal: Degenerative changes of the thoracic spine are noted. No other acute bony abnormality is  noted. Review of the MIP images confirms the above findings. IMPRESSION: Tiny left lower lobe pulmonary embolus. No large central pulmonary embolism is seen. Mild dependent changes and emphysematous changes. Small cyst in the dome of the right lobe of the liver. Aortic Atherosclerosis (ICD10-I70.0) and Emphysema (ICD10-J43.9). Electronically Signed   By: Inez Catalina M.D.   On: 03/26/2017 11:46   Dg Knee Complete 4 Views Left  Result Date: 03/26/2017 CLINICAL DATA:  Chronic left knee pain worsening over the past 2-3 weeks with no known trauma. EXAM: LEFT KNEE - COMPLETE 4+ VIEW COMPARISON:  None in PACs FINDINGS: The bones are subjectively adequately mineralized. There is beaking of the tibial spines. Spurs arise from the articular margins of the femoral condyles and tibial plateaus. There is spurring of the articular margins of the patella. There is mild narrowing of the medial joint space. There  is no acute or healing fracture. There is no joint effusion. 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 No acute bony abnormality. Electronically Signed   By: David  Martinique M.D.   On: 03/26/2017 07:32       Subjective:  Chest pain and sob improved.   Discharge Exam: Vitals:   03/28/17 0831 03/28/17 0924  BP:  114/74  Pulse:  70  Resp:    Temp:    SpO2: 95%    Vitals:   03/27/17 2137 03/28/17 0551 03/28/17 0831 03/28/17 0924  BP: (!) 128/59 140/84  114/74  Pulse: 78 (!) 59  70  Resp: 20     Temp: 97.6 F (36.4 C) (!) 97.5 F (36.4 C)    TempSrc: Oral Oral    SpO2: 95% 98% 95%   Weight:      Height:        General: Pt is alert, awake, not in acute distress Cardiovascular: RRR, S1/S2 +, no rubs, no gallops Respiratory: CTA bilaterally, no wheezing, no rhonchi Abdominal: Soft, NT, ND, bowel sounds + Extremities: no edema, no cyanosis    The results of significant diagnostics from this hospitalization (including imaging, microbiology, ancillary and laboratory) are listed below for reference.     Microbiology: No results found for this or any previous visit (from the past 240 hour(s)).   Labs: BNP (last 3 results) No results for input(s): BNP in the last 8760 hours. Basic Metabolic Panel: Recent Labs  Lab 03/26/17 0943 03/27/17 0534  NA 140 141  K 3.7 4.0  CL 105 108  CO2 27 25  GLUCOSE 93 90  BUN 13 11  CREATININE 1.02* 0.92  CALCIUM 8.9 9.0   Liver Function Tests: Recent Labs  Lab 03/26/17 0943  AST 23  ALT 19  ALKPHOS 79  BILITOT 0.5  PROT 7.0  ALBUMIN 3.8   No results for input(s): LIPASE, AMYLASE in the last 168 hours. No results for input(s): AMMONIA in the last 168 hours. CBC: Recent Labs  Lab 03/26/17 0943 03/27/17 0534 03/28/17 0533  WBC 4.7 4.6 3.9*  HGB 12.2 12.0 11.7*  HCT 37.4 37.4 36.7  MCV 86.0 85.8 85.7  PLT 155 166 166    Cardiac Enzymes: No results for input(s): CKTOTAL, CKMB, CKMBINDEX, TROPONINI in the last 168 hours. BNP: Invalid input(s): POCBNP CBG: No results for input(s): GLUCAP in the last 168 hours. D-Dimer No results for input(s): DDIMER in the last 72 hours. Hgb A1c No results for input(s): HGBA1C in the last 72 hours. Lipid Profile No results for input(s):  CHOL, HDL, LDLCALC, TRIG, CHOLHDL, LDLDIRECT in the last 72 hours. Thyroid function studies No results for input(s): TSH, T4TOTAL, T3FREE, THYROIDAB in the last 72 hours.  Invalid input(s): FREET3 Anemia work up No results for input(s): VITAMINB12, FOLATE, FERRITIN, TIBC, IRON, RETICCTPCT in the last 72 hours. Urinalysis    Component Value Date/Time   COLORURINE YELLOW 12/09/2014 0404   APPEARANCEUR CLEAR 12/09/2014 0404   LABSPEC 1.018 12/09/2014 0404   PHURINE 7.0 12/09/2014 0404   GLUCOSEU NEGATIVE 12/09/2014 0404   HGBUR NEGATIVE 12/09/2014 0404   BILIRUBINUR NEGATIVE 12/09/2014 0404   KETONESUR NEGATIVE 12/09/2014 0404   PROTEINUR NEGATIVE 12/09/2014 0404   UROBILINOGEN 0.2 12/09/2014 0404   NITRITE NEGATIVE 12/09/2014 0404   LEUKOCYTESUR NEGATIVE 12/09/2014 0404   Sepsis Labs Invalid input(s): PROCALCITONIN,  WBC,  LACTICIDVEN Microbiology No results found for this or any previous visit (from the past 240 hour(s)).   Time coordinating discharge: Over 30 minutes  SIGNED:   Hosie Poisson, MD  [facility] 04/01/2017, 8:17 AM Pager   If 7PM-7AM, please contact night-coverage [email or web address] Password [email or web address]

## 2017-04-16 ENCOUNTER — Other Ambulatory Visit: Payer: Self-pay | Admitting: Gastroenterology

## 2017-04-16 DIAGNOSIS — R1084 Generalized abdominal pain: Secondary | ICD-10-CM

## 2017-04-17 ENCOUNTER — Other Ambulatory Visit: Payer: Self-pay | Admitting: Family Medicine

## 2017-04-17 ENCOUNTER — Other Ambulatory Visit

## 2017-04-17 DIAGNOSIS — M7989 Other specified soft tissue disorders: Secondary | ICD-10-CM

## 2017-04-18 ENCOUNTER — Ambulatory Visit
Admission: RE | Admit: 2017-04-18 | Discharge: 2017-04-18 | Disposition: A | Discharge status: Home / Self Care | Source: Ambulatory Visit | Attending: Family Medicine | Admitting: Family Medicine

## 2017-04-18 DIAGNOSIS — M7989 Other specified soft tissue disorders: Secondary | ICD-10-CM

## 2017-04-19 DIAGNOSIS — M26621 Arthralgia of right temporomandibular joint: Secondary | ICD-10-CM | POA: Insufficient documentation

## 2017-04-19 DIAGNOSIS — H9202 Otalgia, left ear: Secondary | ICD-10-CM | POA: Insufficient documentation

## 2017-04-19 DIAGNOSIS — M654 Radial styloid tenosynovitis [de Quervain]: Secondary | ICD-10-CM | POA: Insufficient documentation

## 2017-04-22 ENCOUNTER — Ambulatory Visit
Admission: RE | Admit: 2017-04-22 | Discharge: 2017-04-22 | Disposition: A | Discharge status: Home / Self Care | Source: Ambulatory Visit | Attending: Gastroenterology | Admitting: Gastroenterology

## 2017-04-22 DIAGNOSIS — R1084 Generalized abdominal pain: Secondary | ICD-10-CM

## 2017-04-22 MED ORDER — IOPAMIDOL (ISOVUE-300) INJECTION 61%
125.0000 mL | Freq: Once | INTRAVENOUS | Status: AC | PRN
  Administered 2017-04-22: 125 mL via INTRAVENOUS

## 2017-09-17 DIAGNOSIS — M21162 Varus deformity, not elsewhere classified, left knee: Secondary | ICD-10-CM | POA: Diagnosis not present

## 2017-09-17 DIAGNOSIS — M25662 Stiffness of left knee, not elsewhere classified: Secondary | ICD-10-CM | POA: Diagnosis not present

## 2017-09-17 DIAGNOSIS — M1712 Unilateral primary osteoarthritis, left knee: Secondary | ICD-10-CM | POA: Diagnosis not present

## 2017-09-17 DIAGNOSIS — R269 Unspecified abnormalities of gait and mobility: Secondary | ICD-10-CM | POA: Diagnosis not present

## 2017-09-17 DIAGNOSIS — M25562 Pain in left knee: Secondary | ICD-10-CM | POA: Diagnosis not present

## 2017-09-24 DIAGNOSIS — Z96651 Presence of right artificial knee joint: Secondary | ICD-10-CM | POA: Diagnosis not present

## 2017-09-24 DIAGNOSIS — M1712 Unilateral primary osteoarthritis, left knee: Secondary | ICD-10-CM | POA: Diagnosis not present

## 2017-09-24 DIAGNOSIS — M25562 Pain in left knee: Secondary | ICD-10-CM | POA: Diagnosis not present

## 2017-09-30 DIAGNOSIS — M654 Radial styloid tenosynovitis [de Quervain]: Secondary | ICD-10-CM | POA: Diagnosis not present

## 2017-10-01 DIAGNOSIS — H02051 Trichiasis without entropian right upper eyelid: Secondary | ICD-10-CM | POA: Diagnosis not present

## 2017-10-01 DIAGNOSIS — M1712 Unilateral primary osteoarthritis, left knee: Secondary | ICD-10-CM | POA: Diagnosis not present

## 2017-10-01 DIAGNOSIS — M25562 Pain in left knee: Secondary | ICD-10-CM | POA: Diagnosis not present

## 2017-10-02 DIAGNOSIS — Z1211 Encounter for screening for malignant neoplasm of colon: Secondary | ICD-10-CM | POA: Diagnosis not present

## 2017-10-02 DIAGNOSIS — Z0000 Encounter for general adult medical examination without abnormal findings: Secondary | ICD-10-CM | POA: Diagnosis not present

## 2017-10-02 DIAGNOSIS — R7309 Other abnormal glucose: Secondary | ICD-10-CM | POA: Diagnosis not present

## 2017-10-02 DIAGNOSIS — E039 Hypothyroidism, unspecified: Secondary | ICD-10-CM | POA: Diagnosis not present

## 2017-10-02 DIAGNOSIS — I10 Essential (primary) hypertension: Secondary | ICD-10-CM | POA: Diagnosis not present

## 2017-10-02 DIAGNOSIS — K219 Gastro-esophageal reflux disease without esophagitis: Secondary | ICD-10-CM | POA: Diagnosis not present

## 2017-10-02 DIAGNOSIS — F411 Generalized anxiety disorder: Secondary | ICD-10-CM | POA: Diagnosis not present

## 2017-10-02 DIAGNOSIS — J309 Allergic rhinitis, unspecified: Secondary | ICD-10-CM | POA: Diagnosis not present

## 2017-10-02 DIAGNOSIS — I2699 Other pulmonary embolism without acute cor pulmonale: Secondary | ICD-10-CM | POA: Diagnosis not present

## 2017-10-02 DIAGNOSIS — E785 Hyperlipidemia, unspecified: Secondary | ICD-10-CM | POA: Diagnosis not present

## 2017-10-03 ENCOUNTER — Other Ambulatory Visit: Payer: Self-pay | Admitting: Family Medicine

## 2017-10-03 DIAGNOSIS — Z1231 Encounter for screening mammogram for malignant neoplasm of breast: Secondary | ICD-10-CM

## 2017-10-04 ENCOUNTER — Encounter: Payer: Self-pay | Admitting: Hematology

## 2017-10-04 ENCOUNTER — Telehealth: Payer: Self-pay | Admitting: Hematology

## 2017-10-04 NOTE — Telephone Encounter (Signed)
New referral from Dr. London Pepper to eval for PE. Tc to the pt to schedule an appt. Appt has been scheduled for the pt to see Dr. [name] on 6/11 at 10am. Pt aware to arrive 30 minutes early. Letter mailed.

## 2017-10-08 DIAGNOSIS — M1712 Unilateral primary osteoarthritis, left knee: Secondary | ICD-10-CM | POA: Diagnosis not present

## 2017-10-08 DIAGNOSIS — M25562 Pain in left knee: Secondary | ICD-10-CM | POA: Diagnosis not present

## 2017-10-16 DIAGNOSIS — Z96651 Presence of right artificial knee joint: Secondary | ICD-10-CM | POA: Diagnosis not present

## 2017-10-16 DIAGNOSIS — M25561 Pain in right knee: Secondary | ICD-10-CM | POA: Diagnosis not present

## 2017-10-16 DIAGNOSIS — M25562 Pain in left knee: Secondary | ICD-10-CM | POA: Diagnosis not present

## 2017-10-16 DIAGNOSIS — M1712 Unilateral primary osteoarthritis, left knee: Secondary | ICD-10-CM | POA: Diagnosis not present

## 2017-10-25 ENCOUNTER — Ambulatory Visit

## 2017-10-28 NOTE — Progress Notes (Signed)
HEMATOLOGY/ONCOLOGY CONSULTATION NOTE  Date of Service: 10/29/2017  Patient Care Team: London Pepper, MD as PCP - General (Family Medicine)  CHIEF COMPLAINTS/PURPOSE OF CONSULTATION:  Pulmonary embolism  HISTORY OF PRESENTING ILLNESS:   Beth Potts is a wonderful 67 y.o. female who has been referred to [place] by Dr London Pepper for evaluation and management of Pulmonary embolism. The pt reports that she is doing well overall.   The pt presented to the ED on 03/26/17 with a small PE on the left lower lobe. She was discharged on Xarelto and has continued for 6 months. She notes that she appeared to the ED with significant SOB, and had some swelling in her left leg without visible clot with imaging. She denies having had chest pain at the time of the event. She was not taking prednisone or other steroids at the the time of her PE. She was taking three antibiotics from her dentist due to an infection in her jaw at the time.   She notes that she had taken a train for 6 hours, two weeks before her PE. She notes that she wore compression socks while working in the past as she has varicose veins and occasional leg swelling. She adds that she gets bronchitis about once a year but denies these symptoms when she had her PE. She denies having a venous competence study in the past.   She took PO contraceptives for 5 years in her 53s and denies ever having had a blood clot. She also has two children and has had surgeries, all without blood clots. She took 81mg  aspirin in the past but notes that it caused her varicose veins to bulge out more and would not like to take this preventatively in the future.   The pt reports that she has current concerns for bronchitis and will be seeing her PCP after our visit for this.   Of note prior to the patient's visit today, pt has had US Venous Lower Left completed on 04/18/17 with results revealing No evidence of deep venous thrombosis.   Her CT Angio Chest 03/26/17  showed Tiny left lower lobe pulmonary embolus. No large PE is seen.   Most recent lab results (03/28/17) of CBC  is as follows: all values are WNL except for WBC at 3.9k, HGB at 11.7.  On review of systems, pt reports occasional ankle swelling, some wheezing, some weight gain, stable lower back pains, and denies chest pain, shortness of breath, and any other symptoms.   On PMHx the pt reports PE, Grave's disease and radioactive iodine treatment, and denies any other blood clots. On Social Hx the pt denies ever smoking.  On Family Hx the pt reports maternal stroke, and siblings who take blood thinners, grandfather prostate cancer, grandmother stomach cancer, and denies blood disorders.   MEDICAL HISTORY:  Past Medical History:  Diagnosis Date  . Hyperlipidemia   . Hypertension   . Leukopenia 02/07/2015  . Thyroid disease   . Varicose veins     SURGICAL HISTORY: Past Surgical History:  Procedure Laterality Date  . JOINT REPLACEMENT Right    knee  . ROTATOR CUFF REPAIR Bilateral     SOCIAL HISTORY: Social History   Socioeconomic History  . Marital status: Widowed    Spouse name: Not on file  . Number of children: Not on file  . Years of education: Not on file  . Highest education level: Not on file  Occupational History  . Not on file  Social Needs  . Financial resource strain: Not on file  . Food insecurity:    Worry: Not on file    Inability: Not on file  . Transportation needs:    Medical: Not on file    Non-medical: Not on file  Tobacco Use  . Smoking status: Never Smoker  . Smokeless tobacco: Never Used  Substance and Sexual Activity  . Alcohol use: No    Alcohol/week: 0.0 oz  . Drug use: No  . Sexual activity: Not on file  Lifestyle  . Physical activity:    Days per week: Not on file    Minutes per session: Not on file  . Stress: Not on file  Relationships  . Social connections:    Talks on phone: Not on file    Gets together: Not on file    Attends  religious service: Not on file    Active member of club or organization: Not on file    Attends meetings of clubs or organizations: Not on file    Relationship status: Not on file  . Intimate partner violence:    Fear of current or ex partner: Not on file    Emotionally abused: Not on file    Physically abused: Not on file    Forced sexual activity: Not on file  Other Topics Concern  . Not on file  Social History Narrative  . Not on file    FAMILY HISTORY: Family History  Problem Relation Age of Onset  . Heart disease Mother   . Stroke Mother   . Heart disease Father     ALLERGIES:  has No Known Allergies.  MEDICATIONS:  Current Outpatient Medications  Medication Sig Dispense Refill  . albuterol (PROAIR HFA) 108 (90 Base) MCG/ACT inhaler Take 1-2 puffs every 6 (six) hours as needed by mouth for shortness of breath.    Marland Kitchen albuterol (PROVENTIL) (2.5 MG/3ML) 0.083% nebulizer solution Inhale 3 mLs (2.5 mg total) 2 (two) times daily into the lungs. 75 mL 12  . amitriptyline (ELAVIL) 25 MG tablet Take 1 tablet (25 mg total) by mouth at bedtime. (Patient not taking: Reported on 03/26/2017) 30 tablet 2  . Ergocalciferol (VITAMIN D2) 2000 units TABS Take 2 capsules daily by mouth.    . fluticasone (FLONASE) 50 MCG/ACT nasal spray Place 1 spray at bedtime into both nostrils.    . folic acid (FOLVITE) 1 MG tablet Take 1 mg by mouth daily.    Marland Kitchen levothyroxine (SYNTHROID, LEVOTHROID) 112 MCG tablet Take 112 mcg by mouth daily before breakfast.    . LORazepam (ATIVAN) 0.5 MG tablet Take 0.5 mg by mouth at bedtime as needed for anxiety or sleep. Reported on 11/28/2015    . MAGNESIUM PO Take 1 tablet by mouth daily.    . meloxicam (MOBIC) 15 MG tablet Take 1 tablet (15 mg total) by mouth daily. 30 tablet 2  . methocarbamol (ROBAXIN) 500 MG tablet Take 1 tablet (500 mg total) by mouth 2 (two) times daily as needed for muscle spasms. (Patient not taking: Reported on 03/26/2017) 20 tablet 0  .  metoprolol succinate (TOPROL XL) 25 MG 24 hr tablet Take 25 mg 2 (two) times daily by mouth.    . montelukast (SINGULAIR) 10 MG tablet Take 10 mg by mouth at bedtime.     Marland Kitchen olmesartan (BENICAR) 20 MG tablet Take 10 mg by mouth daily.     Marland Kitchen omeprazole (PRILOSEC) 20 MG capsule Take 20 mg daily by mouth.    Marland Kitchen  Rivaroxaban (XARELTO) 15 MG TABS tablet Take 1 tablet (15 mg total) 2 (two) times daily with a meal for 21 days by mouth. 42 tablet 0  . rivaroxaban (XARELTO) 20 MG TABS tablet Take 1 tablet (20 mg total) daily with supper by mouth. 30 tablet 1  . traMADol (ULTRAM) 50 MG tablet Take 1 tablet (50 mg total) every 6 (six) hours as needed by mouth for moderate pain. 10 tablet 0  . TURMERIC PO Take 1 Dose daily by mouth.     No current facility-administered medications for this visit.     REVIEW OF SYSTEMS:    10 Point review of Systems was done is negative except as noted above.  PHYSICAL EXAMINATION:  . Vitals:   10/29/17 1033  BP: (!) 144/76  Pulse: 61  Resp: 18  Temp: 98 F (36.7 C)  SpO2: 98%   Filed Weights   10/29/17 1033  Weight: 230 lb 3.2 oz (104.4 kg)   .Body mass index is 37.16 kg/m.  GENERAL:alert, in no acute distress and comfortable SKIN: no acute rashes, no significant lesions EYES: conjunctiva are pink and non-injected, sclera anicteric OROPHARYNX: MMM, no exudates, no oropharyngeal erythema or ulceration NECK: supple, no JVD LYMPH:  no palpable lymphadenopathy in the cervical, axillary or inguinal regions LUNGS: clear to auscultation b/l with normal respiratory effort HEART: regular rate & rhythm ABDOMEN:  normoactive bowel sounds , non tender, not distended. Extremity: no pedal edema PSYCH: alert & oriented x 3 with fluent speech NEURO: no focal motor/sensory deficits  LABORATORY DATA:  I have reviewed the data as listed  . CBC Latest Ref Rng & Units 03/28/2017 03/27/2017 03/26/2017  WBC 4.0 - 10.5 K/uL 3.9(L) 4.6 4.7  Hemoglobin 12.0 - 15.0 g/dL  11.7(L) 12.0 12.2  Hematocrit 36.0 - 46.0 % 36.7 37.4 37.4  Platelets 150 - 400 K/uL 166 166 155    . CMP Latest Ref Rng & Units 03/27/2017 03/26/2017 12/09/2014  Glucose 65 - 99 mg/dL 90 93 95  BUN 6 - 20 mg/dL 11 13 29(H)  Creatinine 0.44 - 1.00 mg/dL 0.92 1.02(H) 1.16(H)  Sodium 135 - 145 mmol/L 141 140 137  Potassium 3.5 - 5.1 mmol/L 4.0 3.7 4.4  Chloride 101 - 111 mmol/L 108 105 103  CO2 22 - 32 mmol/L 25 27 26   Calcium 8.9 - 10.3 mg/dL 9.0 8.9 9.3  Total Protein 6.5 - 8.1 g/dL - 7.0 -  Total Bilirubin 0.3 - 1.2 mg/dL - 0.5 -  Alkaline Phos 38 - 126 U/L - 79 -  AST 15 - 41 U/L - 23 -  ALT 14 - 54 U/L - 19 -     RADIOGRAPHIC STUDIES: I have personally reviewed the radiological images as listed and agreed with the findings in the report. No results found.  ASSESSMENT & PLAN:   67 y.o. female with  1. Pulmonary Embolism - small PE. No evidence of overt DVT Pt seems to have some provoking causes for her small PE including an infection at the time and long-distance travel, varicose veins and obesity. .Body mass index is 37.16 kg/m.  No FHx h/o VTE PLAN  -Discussed patient's most recent lUS ext and CTA chest and labs from 03/28/17, WBC at 3.9k, HGB at 11.7 and PLT were WNL at 166k -Will complete work up with labs today for genetic predisposition evaluations., APLA testing and D-dimer  -Continue using graded compression socks --Will see pt back in 3 weeks -Continue with age-appropriate cancer screening with PCP -  Will discuss duration of blood thinners based on labs at next visit   2.  Patient Active Problem List   Diagnosis Date Noted  . Pulmonary emboli ([place]) 03/26/2017  . Spider veins of both lower extremities 11/28/2015  . Leukopenia 02/07/2015  -f/u with PCP  Labs today RTC with Dr [name] in 3 weeks   All of the patients questions were answered with apparent satisfaction. The patient knows to call the clinic with any problems, questions or concerns.  The toal  time spent in the appt was 45 minutes and more than 50% was on counseling and direct patient cares.     Sullivan Lone MD MS AAHIVMS [facility] Hematology/Oncology Physician [facility]  (Office):       [phone] (Work cell):  [phone] (Fax):           [phone]  10/29/2017 11:14 AM  I, Baldwin Jamaica, am acting as a Education administrator for Dr [name].   .I have reviewed the above documentation for accuracy and completeness, and I agree with the above. Brunetta Genera MD

## 2017-10-29 ENCOUNTER — Inpatient Hospital Stay: Payer: Medicare Other | Attending: Hematology | Admitting: Hematology

## 2017-10-29 ENCOUNTER — Telehealth: Payer: Self-pay | Admitting: Hematology

## 2017-10-29 ENCOUNTER — Inpatient Hospital Stay: Payer: Medicare Other

## 2017-10-29 VITALS — BP 144/76 | HR 61 | Temp 98.0°F | Resp 18 | Ht 66.0 in | Wt 230.2 lb

## 2017-10-29 DIAGNOSIS — Z7901 Long term (current) use of anticoagulants: Secondary | ICD-10-CM | POA: Diagnosis not present

## 2017-10-29 DIAGNOSIS — Z6837 Body mass index (BMI) 37.0-37.9, adult: Secondary | ICD-10-CM

## 2017-10-29 DIAGNOSIS — D6859 Other primary thrombophilia: Secondary | ICD-10-CM

## 2017-10-29 DIAGNOSIS — I10 Essential (primary) hypertension: Secondary | ICD-10-CM | POA: Diagnosis not present

## 2017-10-29 DIAGNOSIS — I8390 Asymptomatic varicose veins of unspecified lower extremity: Secondary | ICD-10-CM | POA: Diagnosis not present

## 2017-10-29 DIAGNOSIS — I2699 Other pulmonary embolism without acute cor pulmonale: Secondary | ICD-10-CM | POA: Diagnosis not present

## 2017-10-29 DIAGNOSIS — J209 Acute bronchitis, unspecified: Secondary | ICD-10-CM | POA: Diagnosis not present

## 2017-10-29 DIAGNOSIS — E669 Obesity, unspecified: Secondary | ICD-10-CM | POA: Insufficient documentation

## 2017-10-29 DIAGNOSIS — J329 Chronic sinusitis, unspecified: Secondary | ICD-10-CM | POA: Diagnosis not present

## 2017-10-29 LAB — CBC WITH DIFFERENTIAL/PLATELET
BASOS ABS: 0 10*3/uL (ref 0.0–0.1)
BASOS PCT: 1 %
EOS PCT: 1 %
Eosinophils Absolute: 0 10*3/uL (ref 0.0–0.5)
HCT: 34.2 % — ABNORMAL LOW (ref 34.8–46.6)
Hemoglobin: 11.1 g/dL — ABNORMAL LOW (ref 11.6–15.9)
Lymphocytes Relative: 34 %
Lymphs Abs: 1.2 10*3/uL (ref 0.9–3.3)
MCH: 26.9 pg (ref 25.1–34.0)
MCHC: 32.3 g/dL (ref 31.5–36.0)
MCV: 83.4 fL (ref 79.5–101.0)
MONO ABS: 0.3 10*3/uL (ref 0.1–0.9)
Monocytes Relative: 8 %
Neutro Abs: 2.1 10*3/uL (ref 1.5–6.5)
Neutrophils Relative %: 56 %
PLATELETS: 217 10*3/uL (ref 145–400)
RBC: 4.11 MIL/uL (ref 3.70–5.45)
RDW: 15.1 % — AB (ref 11.2–14.5)
WBC: 3.7 10*3/uL — AB (ref 3.9–10.3)

## 2017-10-29 LAB — CMP (CANCER CENTER ONLY)
ALT: 12 U/L (ref 0–55)
AST: 20 U/L (ref 5–34)
Albumin: 3.8 g/dL (ref 3.5–5.0)
Alkaline Phosphatase: 88 U/L (ref 40–150)
Anion gap: 8 (ref 3–11)
BUN: 14 mg/dL (ref 7–26)
CHLORIDE: 105 mmol/L (ref 98–109)
CO2: 26 mmol/L (ref 22–29)
Calcium: 9.6 mg/dL (ref 8.4–10.4)
Creatinine: 1.01 mg/dL (ref 0.60–1.10)
GFR, EST NON AFRICAN AMERICAN: 57 mL/min — AB (ref >60)
GFR, Est AFR Am: >60 mL/min (ref >60)
Glucose, Bld: 83 mg/dL (ref 70–140)
Potassium: 4.2 mmol/L (ref 3.5–5.1)
Sodium: 139 mmol/L (ref 136–145)
Total Bilirubin: 0.4 mg/dL (ref 0.2–1.2)
Total Protein: 7.7 g/dL (ref 6.4–8.3)

## 2017-10-29 LAB — D-DIMER, QUANTITATIVE (NOT AT ARMC): D DIMER QUANT: 0.46 ug{FEU}/mL (ref 0.00–0.50)

## 2017-10-29 NOTE — Telephone Encounter (Signed)
Appointments scheduled AVS/Calendar printed per 6/11 los °

## 2017-10-31 LAB — DRVVT MIX: dRVVT Mix: 99.4 s — ABNORMAL HIGH (ref 0.0–47.0)

## 2017-10-31 LAB — BETA-2-GLYCOPROTEIN I ABS, IGG/M/A: Beta-2 Glyco I IgG: <9 GPI IgG units (ref 0–20)

## 2017-10-31 LAB — LUPUS ANTICOAGULANT PANEL
DRVVT: 124.6 s — ABNORMAL HIGH (ref 0.0–47.0)
PTT Lupus Anticoagulant: 48.6 s (ref 0.0–51.9)

## 2017-10-31 LAB — CARDIOLIPIN ANTIBODIES, IGG, IGM, IGA
Anticardiolipin IgA: <9 APL U/mL (ref 0–11)
Anticardiolipin IgM: <9 MPL U/mL (ref 0–12)

## 2017-10-31 LAB — DRVVT CONFIRM: DRVVT CONFIRM: 1.6 ratio — AB (ref 0.8–1.2)

## 2017-11-01 ENCOUNTER — Ambulatory Visit
Admission: RE | Admit: 2017-11-01 | Discharge: 2017-11-01 | Disposition: A | Discharge status: Home / Self Care | Payer: Medicare Other | Source: Ambulatory Visit | Attending: Family Medicine | Admitting: Family Medicine

## 2017-11-01 DIAGNOSIS — Z1231 Encounter for screening mammogram for malignant neoplasm of breast: Secondary | ICD-10-CM

## 2017-11-04 LAB — FACTOR 5 LEIDEN

## 2017-11-04 LAB — PROTHROMBIN GENE MUTATION

## 2017-11-07 DIAGNOSIS — J22 Unspecified acute lower respiratory infection: Secondary | ICD-10-CM | POA: Diagnosis not present

## 2017-11-11 DIAGNOSIS — M654 Radial styloid tenosynovitis [de Quervain]: Secondary | ICD-10-CM | POA: Diagnosis not present

## 2017-11-18 NOTE — Progress Notes (Signed)
HEMATOLOGY/ONCOLOGY CONSULTATION NOTE  Date of Service: 11/19/2017  Patient Care Team: Beth Pepper, MD as PCP - General (Family Medicine)  CHIEF COMPLAINTS/PURPOSE OF CONSULTATION:  Pulmonary embolism  HISTORY OF PRESENTING ILLNESS:   Beth Potts is a wonderful 67 y.o. female who has been referred to [place] by Dr Beth Potts for evaluation and management of Pulmonary embolism. The pt reports that she is doing well overall.   The pt presented to the ED on 03/26/17 with a small PE on the left lower lobe. She was discharged on Xarelto and has continued for 6 months. She notes that she appeared to the ED with significant SOB, and had some swelling in her left leg without visible clot with imaging. She denies having had chest pain at the time of the event. She was not taking prednisone or other steroids at the the time of her PE. She was taking three antibiotics from her dentist due to an infection in her jaw at the time.   She notes that she had taken a train for 6 hours, two weeks before her PE. She notes that she wore compression socks while working in the past as she has varicose veins and occasional leg swelling. She adds that she gets bronchitis about once a year but denies these symptoms when she had her PE. She denies having a venous competence study in the past.   She took PO contraceptives for 5 years in her 85s and denies ever having had a blood clot. She also has two children and has had surgeries, all without blood clots. She took 81mg  aspirin in the past but notes that it caused her varicose veins to bulge out more and would not like to take this preventatively in the future.   The pt reports that she has current concerns for bronchitis and will be seeing her PCP after our visit for this.   Of note prior to the patient's visit today, pt has had US Venous Lower Left completed on 04/18/17 with results revealing No evidence of deep venous thrombosis.   Her CT Angio Chest 03/26/17  showed Tiny left lower lobe pulmonary embolus. No large PE is seen.   Most recent lab results (03/28/17) of CBC  is as follows: all values are WNL except for WBC at 3.9k, HGB at 11.7.  On review of systems, pt reports occasional ankle swelling, some wheezing, some weight gain, stable lower back pains, and denies chest pain, shortness of breath, and any other symptoms.   On PMHx the pt reports PE, Grave's disease and radioactive iodine treatment, and denies any other blood clots. On Social Hx the pt denies ever smoking.  On Family Hx the pt reports maternal stroke, and siblings who take blood thinners, grandfather prostate cancer, grandmother stomach cancer, and denies blood disorders.   Interval History:   AMALIYA Potts returns today regarding her pulmonary embolism. The patient's last visit with [place] was on 10/29/17. The pt reports that she is doing well overall.   The pt reports that she continues to take Xarelto, having started in November. She denies any recent leg swelling or SOB. The pt notes that she recently got over an infection.   The pt notes that she does not want to take aspirin long term but is open to continuing on Xarelto long term.   Lab results (10/29/17) of CBC w/diff, CMP is as follows: all values are WNL except for WBC at 3.7k, HGB at 11.1, HCT at 34.2,  RDW at 15.1. Lupus anticoagulant panel 10/29/17 showed concerns for possible lupus anticoagulant.  On review of systems, pt reports stable energy levels, resolved recent infection, and denies breathing difficulty, leg swelling, and any other symptoms.   MEDICAL HISTORY:  Past Medical History:  Diagnosis Date  . Hyperlipidemia   . Hypertension   . Leukopenia 02/07/2015  . Thyroid disease   . Varicose veins     SURGICAL HISTORY: Past Surgical History:  Procedure Laterality Date  . JOINT REPLACEMENT Right    knee  . ROTATOR CUFF REPAIR Bilateral     SOCIAL HISTORY: Social History   Socioeconomic History  .  Marital status: Widowed    Spouse name: Not on file  . Number of children: Not on file  . Years of education: Not on file  . Highest education level: Not on file  Occupational History  . Not on file  Social Needs  . Financial resource strain: Not on file  . Food insecurity:    Worry: Not on file    Inability: Not on file  . Transportation needs:    Medical: Not on file    Non-medical: Not on file  Tobacco Use  . Smoking status: Never Smoker  . Smokeless tobacco: Never Used  Substance and Sexual Activity  . Alcohol use: No    Alcohol/week: 0.0 oz  . Drug use: No  . Sexual activity: Not on file  Lifestyle  . Physical activity:    Days per week: Not on file    Minutes per session: Not on file  . Stress: Not on file  Relationships  . Social connections:    Talks on phone: Not on file    Gets together: Not on file    Attends religious service: Not on file    Active member of club or organization: Not on file    Attends meetings of clubs or organizations: Not on file    Relationship status: Not on file  . Intimate partner violence:    Fear of current or ex partner: Not on file    Emotionally abused: Not on file    Physically abused: Not on file    Forced sexual activity: Not on file  Other Topics Concern  . Not on file  Social History Narrative  . Not on file    FAMILY HISTORY: Family History  Problem Relation Age of Onset  . Heart disease Mother   . Stroke Mother   . Heart disease Father     ALLERGIES:  has No Known Allergies.  MEDICATIONS:  Current Outpatient Medications  Medication Sig Dispense Refill  . albuterol (PROAIR HFA) 108 (90 Base) MCG/ACT inhaler Take 1-2 puffs every 6 (six) hours as needed by mouth for shortness of breath.    Marland Kitchen albuterol (PROVENTIL) (2.5 MG/3ML) 0.083% nebulizer solution Inhale 3 mLs (2.5 mg total) 2 (two) times daily into the lungs. 75 mL 12  . amitriptyline (ELAVIL) 25 MG tablet Take 1 tablet (25 mg total) by mouth at bedtime.  (Patient not taking: Reported on 03/26/2017) 30 tablet 2  . Ergocalciferol (VITAMIN D2) 2000 units TABS Take 2 capsules daily by mouth.    . fluticasone (FLONASE) 50 MCG/ACT nasal spray Place 1 spray at bedtime into both nostrils.    . folic acid (FOLVITE) 1 MG tablet Take 1 mg by mouth daily.    Marland Kitchen levothyroxine (SYNTHROID, LEVOTHROID) 112 MCG tablet Take 112 mcg by mouth daily before breakfast.    . LORazepam (ATIVAN) 0.5  MG tablet Take 0.5 mg by mouth at bedtime as needed for anxiety or sleep. Reported on 11/28/2015    . MAGNESIUM PO Take 1 tablet by mouth daily.    . meloxicam (MOBIC) 15 MG tablet Take 1 tablet (15 mg total) by mouth daily. (Patient not taking: Reported on 11/19/2017) 30 tablet 2  . metoprolol succinate (TOPROL XL) 25 MG 24 hr tablet Take 25 mg 2 (two) times daily by mouth.    . montelukast (SINGULAIR) 10 MG tablet Take 10 mg by mouth at bedtime.     Marland Kitchen olmesartan (BENICAR) 20 MG tablet Take 10 mg by mouth daily.     Marland Kitchen omeprazole (PRILOSEC) 20 MG capsule Take 20 mg daily by mouth.    . Rivaroxaban (XARELTO) 15 MG TABS tablet Take 1 tablet (15 mg total) 2 (two) times daily with a meal for 21 days by mouth. 42 tablet 0  . rivaroxaban (XARELTO) 20 MG TABS tablet Take 1 tablet (20 mg total) daily with supper by mouth. 30 tablet 1  . traMADol (ULTRAM) 50 MG tablet Take 1 tablet (50 mg total) every 6 (six) hours as needed by mouth for moderate pain. 10 tablet 0  . TURMERIC PO Take 1 Dose daily by mouth.     No current facility-administered medications for this visit.     REVIEW OF SYSTEMS:    A 10+ POINT REVIEW OF SYSTEMS WAS OBTAINED including neurology, dermatology, psychiatry, cardiac, respiratory, lymph, extremities, GI, GU, Musculoskeletal, constitutional, breasts, reproductive, HEENT.  All pertinent positives are noted in the HPI.  All others are negative.   PHYSICAL EXAMINATION:  . Vitals:   11/19/17 1548  BP: 132/77  Pulse: 77  Resp: 18  Temp: 98 F (36.7 C)  SpO2:  99%   Filed Weights   11/19/17 1548  Weight: 228 lb 14.4 oz (103.8 kg)   .Body mass index is 36.95 kg/m.  GENERAL:alert, in no acute distress and comfortable SKIN: no acute rashes, no significant lesions EYES: conjunctiva are pink and non-injected, sclera anicteric OROPHARYNX: MMM, no exudates, no oropharyngeal erythema or ulceration NECK: supple, no JVD LYMPH:  no palpable lymphadenopathy in the cervical, axillary or inguinal regions LUNGS: clear to auscultation b/l with normal respiratory effort HEART: regular rate & rhythm ABDOMEN:  normoactive bowel sounds , non tender, not distended. No palpable hepatosplenomegaly.  Extremity: no pedal edema PSYCH: alert & oriented x 3 with fluent speech NEURO: no focal motor/sensory deficits   LABORATORY DATA:  I have reviewed the data as listed  . CBC Latest Ref Rng & Units 10/29/2017 03/28/2017 03/27/2017  WBC 3.9 - 10.3 K/uL 3.7(L) 3.9(L) 4.6  Hemoglobin 11.6 - 15.9 g/dL 11.1(L) 11.7(L) 12.0  Hematocrit 34.8 - 46.6 % 34.2(L) 36.7 37.4  Platelets 145 - 400 K/uL 217 166 166    . CMP Latest Ref Rng & Units 10/29/2017 03/27/2017 03/26/2017  Glucose 70 - 140 mg/dL 83 90 93  BUN 7 - 26 mg/dL 14 11 13   Creatinine 0.60 - 1.10 mg/dL 1.01 0.92 1.02(H)  Sodium 136 - 145 mmol/L 139 141 140  Potassium 3.5 - 5.1 mmol/L 4.2 4.0 3.7  Chloride 98 - 109 mmol/L 105 108 105  CO2 22 - 29 mmol/L 26 25 27   Calcium 8.4 - 10.4 mg/dL 9.6 9.0 8.9  Total Protein 6.4 - 8.3 g/dL 7.7 - 7.0  Total Bilirubin 0.2 - 1.2 mg/dL 0.4 - 0.5  Alkaline Phos 40 - 150 U/L 88 - 79  AST 5 - 34  U/L 20 - 23  ALT 0 - 55 U/L 12 - 19   Component     Latest Ref Rng & Units 10/29/2017  Beta-2 Glycoprotein I Ab, IgG     0 - 20 GPI IgG units <9  Beta-2-Glycoprotein I IgM     0 - 32 GPI IgM units <9  Beta-2-Glycoprotein I IgA     0 - 25 GPI IgA units <9  Anticardiolipin Ab,IgG,Qn     0 - 14 GPL U/mL <9  Anticardiolipin Ab,IgM,Qn     0 - 12 MPL U/mL <9  Anticardiolipin  Ab,IgA,Qn     0 - 11 APL U/mL <9  PTT Lupus Anticoagulant     0.0 - 51.9 sec 48.6  DRVVT     0.0 - 47.0 sec 124.6 (H)  Lupus Anticoag Interp      Comment:  Recommendations-PTGENE:      Comment  Recommendations-F5LEID:      Comment  D-Dimer, Quant     0.00 - 0.50 ug/mL-FEU 0.46   Lupus anticoagulant panel      The value has a corrected status.      No reference range information available      Resulting Lab: [place] [facility]      Comments:           (NOTE)           Results are consistent with the presence of a lupus            Anticoagulant.  Factor 5 leiden      No reference range information available      Resulting Lab: [place] [facility]      Comments:           (NOTE)           Result:  Negative (no mutation found)  Prothrombin gene mutation      No reference range information available      Resulting Lab: [place] [facility]      Comments:           (NOTE)           NEGATIVE           No mutation identified.  RADIOGRAPHIC STUDIES: I have personally reviewed the radiological images as listed and agreed with the findings in the report. Mm 3d Screen Breast Bilateral  Result Date: 11/01/2017 CLINICAL DATA:  Screening. EXAM: DIGITAL SCREENING BILATERAL MAMMOGRAM WITH TOMO AND CAD COMPARISON:  Previous exam(s). ACR Breast Density Category b: There are scattered areas of fibroglandular density. FINDINGS: There are no findings suspicious for malignancy. Images were processed with CAD. IMPRESSION: No mammographic evidence of malignancy. A result letter of this screening mammogram will be mailed directly to the patient. RECOMMENDATION: Screening mammogram in one year. (Code:SM-B-01Y) BI-RADS CATEGORY  1: Negative. Electronically Signed   By: Curlene Dolphin M.D.   On: 11/01/2017 16:32    ASSESSMENT & PLAN:   67 y.o. female with  1. Pulmonary Embolism - small PE. No evidence of overt DVT Pt seems to have some provoking causes for  her small PE including an infection at the time and long-distance travel, varicose veins and obesity. .Body mass index is 36.95 kg/m.  No FHx h/o VTE  2. Lupus anticoagulant Positive (?true -- will need to evaluation for persistence of ab. Could also be false positive from Xarelto) PLAN: -Discussed pt labwork from 10/29/17; blood chemistries are normal -Antiphospholipid antibodies were negative except for  the Lupus anticoagulant which was noted to be present. -FVL and PGM neg -Discussed that the lupus anticoagulant could be false positive as a Xarelto medication effect -If dRVVT is not a false positive and is persistent on recheck 12 weeks later and off Xarelto, this would be indicative of the need to be on long term blood thinners for an acquired thrombophilia. -Will repeat labs in 3 months, and the pt will hold Xarelto for 3 days prior to taking labs -Will see pt back in 3 months with labs. -overall patient appears to be leaning today long term anticoagulation.  -Continue using graded compression socks -Continue with age-appropriate cancer screening with PCP -Will discuss duration of blood thinners based on labs at next visit   2.  Patient Active Problem List   Diagnosis Date Noted  . Pulmonary emboli ([place]) 03/26/2017  . Spider veins of both lower extremities 11/28/2015  . Leukopenia 02/07/2015  -f/u with PCP   RTC with Dr [name] in 3 months Labs 1 week prior to appointment    All of the patients questions were answered with apparent satisfaction. The patient knows to call the clinic with any problems, questions or concerns.  The total time spent in the appt was 20 minutes and more than 50% was on counseling and direct patient cares.     Sullivan Lone MD MS AAHIVMS [facility] Hematology/Oncology Physician [facility]  (Office):       [phone] (Work cell):  [phone] (Fax):           [phone]  11/19/2017 4:31 PM  I, Baldwin [place], am acting as a Education administrator  for Dr [name].   .I have reviewed the above documentation for accuracy and completeness, and I agree with the above. Brunetta Genera MD

## 2017-11-19 ENCOUNTER — Telehealth: Payer: Self-pay | Admitting: Hematology

## 2017-11-19 ENCOUNTER — Inpatient Hospital Stay: Payer: Medicare Other | Attending: Hematology | Admitting: Hematology

## 2017-11-19 ENCOUNTER — Encounter: Payer: Self-pay | Admitting: Hematology

## 2017-11-19 VITALS — BP 132/77 | HR 77 | Temp 98.0°F | Resp 18 | Ht 66.0 in | Wt 228.9 lb

## 2017-11-19 DIAGNOSIS — I10 Essential (primary) hypertension: Secondary | ICD-10-CM | POA: Diagnosis not present

## 2017-11-19 DIAGNOSIS — I2699 Other pulmonary embolism without acute cor pulmonale: Secondary | ICD-10-CM | POA: Diagnosis not present

## 2017-11-19 DIAGNOSIS — Z7901 Long term (current) use of anticoagulants: Secondary | ICD-10-CM | POA: Diagnosis not present

## 2017-11-19 DIAGNOSIS — D6859 Other primary thrombophilia: Secondary | ICD-10-CM

## 2017-11-19 DIAGNOSIS — R760 Raised antibody titer: Secondary | ICD-10-CM

## 2017-11-19 NOTE — Telephone Encounter (Signed)
Scheduled appt per 7/2 los - gave patient AVS and calender per los 

## 2017-11-20 DIAGNOSIS — J019 Acute sinusitis, unspecified: Secondary | ICD-10-CM | POA: Diagnosis not present

## 2017-11-20 DIAGNOSIS — R05 Cough: Secondary | ICD-10-CM | POA: Diagnosis not present

## 2018-01-23 DIAGNOSIS — E039 Hypothyroidism, unspecified: Secondary | ICD-10-CM | POA: Diagnosis not present

## 2018-01-23 DIAGNOSIS — I10 Essential (primary) hypertension: Secondary | ICD-10-CM | POA: Diagnosis not present

## 2018-01-23 DIAGNOSIS — M25569 Pain in unspecified knee: Secondary | ICD-10-CM | POA: Diagnosis not present

## 2018-01-23 DIAGNOSIS — I2699 Other pulmonary embolism without acute cor pulmonale: Secondary | ICD-10-CM | POA: Diagnosis not present

## 2018-01-23 DIAGNOSIS — J309 Allergic rhinitis, unspecified: Secondary | ICD-10-CM | POA: Diagnosis not present

## 2018-01-23 DIAGNOSIS — R062 Wheezing: Secondary | ICD-10-CM | POA: Diagnosis not present

## 2018-01-23 DIAGNOSIS — K219 Gastro-esophageal reflux disease without esophagitis: Secondary | ICD-10-CM | POA: Diagnosis not present

## 2018-01-23 DIAGNOSIS — Z23 Encounter for immunization: Secondary | ICD-10-CM | POA: Diagnosis not present

## 2018-01-23 DIAGNOSIS — F411 Generalized anxiety disorder: Secondary | ICD-10-CM | POA: Diagnosis not present

## 2018-01-31 DIAGNOSIS — M7122 Synovial cyst of popliteal space [Baker], left knee: Secondary | ICD-10-CM | POA: Diagnosis not present

## 2018-01-31 DIAGNOSIS — M1712 Unilateral primary osteoarthritis, left knee: Secondary | ICD-10-CM | POA: Diagnosis not present

## 2018-02-10 DIAGNOSIS — M654 Radial styloid tenosynovitis [de Quervain]: Secondary | ICD-10-CM | POA: Diagnosis not present

## 2018-02-18 DIAGNOSIS — M7122 Synovial cyst of popliteal space [Baker], left knee: Secondary | ICD-10-CM | POA: Diagnosis not present

## 2018-02-18 DIAGNOSIS — M25569 Pain in unspecified knee: Secondary | ICD-10-CM | POA: Insufficient documentation

## 2018-02-19 ENCOUNTER — Inpatient Hospital Stay: Payer: Medicare Other | Attending: Hematology

## 2018-02-19 DIAGNOSIS — Z7901 Long term (current) use of anticoagulants: Secondary | ICD-10-CM | POA: Insufficient documentation

## 2018-02-19 DIAGNOSIS — R760 Raised antibody titer: Secondary | ICD-10-CM

## 2018-02-19 DIAGNOSIS — D6859 Other primary thrombophilia: Secondary | ICD-10-CM | POA: Insufficient documentation

## 2018-02-19 DIAGNOSIS — I2699 Other pulmonary embolism without acute cor pulmonale: Secondary | ICD-10-CM

## 2018-02-19 LAB — BASIC METABOLIC PANEL
Anion gap: 11 (ref 5–15)
BUN: 13 mg/dL (ref 8–23)
CALCIUM: 9.6 mg/dL (ref 8.9–10.3)
CO2: 24 mmol/L (ref 22–32)
Chloride: 104 mmol/L (ref 98–111)
Creatinine, Ser: 1.1 mg/dL — ABNORMAL HIGH (ref 0.44–1.00)
GFR calc non Af Amer: 52 mL/min — ABNORMAL LOW (ref >60)
GFR, EST AFRICAN AMERICAN: 60 mL/min — AB (ref >60)
Glucose, Bld: 140 mg/dL — ABNORMAL HIGH (ref 70–99)
Potassium: 3.9 mmol/L (ref 3.5–5.1)
Sodium: 139 mmol/L (ref 135–145)

## 2018-02-19 LAB — CBC WITH DIFFERENTIAL/PLATELET
BASOS PCT: 0 %
Basophils Absolute: 0 10*3/uL (ref 0.0–0.1)
EOS PCT: 0 %
Eosinophils Absolute: 0 10*3/uL (ref 0.0–0.5)
HCT: 36.6 % (ref 34.8–46.6)
Hemoglobin: 11.8 g/dL (ref 11.6–15.9)
Lymphocytes Relative: 23 %
Lymphs Abs: 0.8 10*3/uL — ABNORMAL LOW (ref 0.9–3.3)
MCH: 26.7 pg (ref 25.1–34.0)
MCHC: 32.4 g/dL (ref 31.5–36.0)
MCV: 82.3 fL (ref 79.5–101.0)
MONO ABS: 0.2 10*3/uL (ref 0.1–0.9)
MONOS PCT: 6 %
NEUTROS PCT: 71 %
Neutro Abs: 2.6 10*3/uL (ref 1.5–6.5)
PLATELETS: 207 10*3/uL (ref 145–400)
RBC: 4.44 MIL/uL (ref 3.70–5.45)
RDW: 14.7 % — AB (ref 11.2–14.5)
WBC: 3.6 10*3/uL — ABNORMAL LOW (ref 3.9–10.3)

## 2018-02-22 LAB — LUPUS ANTICOAGULANT PANEL
DRVVT: 100.2 s — ABNORMAL HIGH (ref 0.0–47.0)
PTT LA: 38.3 s (ref 0.0–51.9)

## 2018-02-22 LAB — DRVVT CONFIRM: dRVVT Confirm: 1.7 ratio — ABNORMAL HIGH (ref 0.8–1.2)

## 2018-02-22 LAB — DRVVT MIX: dRVVT Mix: 70.3 s — ABNORMAL HIGH (ref 0.0–47.0)

## 2018-02-26 ENCOUNTER — Encounter: Payer: Self-pay | Admitting: Hematology

## 2018-02-26 ENCOUNTER — Inpatient Hospital Stay (HOSPITAL_BASED_OUTPATIENT_CLINIC_OR_DEPARTMENT_OTHER): Payer: Medicare Other | Admitting: Hematology

## 2018-02-26 VITALS — BP 125/72 | HR 64 | Temp 97.7°F | Resp 18 | Ht 66.0 in | Wt 224.2 lb

## 2018-02-26 DIAGNOSIS — R760 Raised antibody titer: Secondary | ICD-10-CM

## 2018-02-26 DIAGNOSIS — I2699 Other pulmonary embolism without acute cor pulmonale: Secondary | ICD-10-CM

## 2018-02-26 DIAGNOSIS — D6859 Other primary thrombophilia: Secondary | ICD-10-CM | POA: Diagnosis not present

## 2018-02-26 DIAGNOSIS — Z7901 Long term (current) use of anticoagulants: Secondary | ICD-10-CM | POA: Diagnosis not present

## 2018-02-26 NOTE — Progress Notes (Signed)
HEMATOLOGY/ONCOLOGY CLINIC NOTE  Date of Service: 02/26/2018  Patient Care Team: London Pepper, MD as PCP - General (Family Medicine)  CHIEF COMPLAINTS/PURPOSE OF CONSULTATION:  Pulmonary embolism  HISTORY OF PRESENTING ILLNESS:   Beth Potts is a wonderful 67 y.o. female who has been referred to [place] by Dr London Pepper for evaluation and management of Pulmonary embolism. The pt reports that she is doing well overall.   The pt presented to the ED on 03/26/17 with a small PE on the left lower lobe. She was discharged on Xarelto and has continued for 6 months. She notes that she appeared to the ED with significant SOB, and had some swelling in her left leg without visible clot with imaging. She denies having had chest pain at the time of the event. She was not taking prednisone or other steroids at the the time of her PE. She was taking three antibiotics from her dentist due to an infection in her jaw at the time.   She notes that she had taken a train for 6 hours, two weeks before her PE. She notes that she wore compression socks while working in the past as she has varicose veins and occasional leg swelling. She adds that she gets bronchitis about once a year but denies these symptoms when she had her PE. She denies having a venous competence study in the past.   She took PO contraceptives for 5 years in her 68s and denies ever having had a blood clot. She also has two children and has had surgeries, all without blood clots. She took 81mg  aspirin in the past but notes that it caused her varicose veins to bulge out more and would not like to take this preventatively in the future.   The pt reports that she has current concerns for bronchitis and will be seeing her PCP after our visit for this.   Of note prior to the patient's visit today, pt has had US Venous Lower Left completed on 04/18/17 with results revealing No evidence of deep venous thrombosis.   Her CT Angio Chest 03/26/17  showed Tiny left lower lobe pulmonary embolus. No large PE is seen.   Most recent lab results (03/28/17) of CBC  is as follows: all values are WNL except for WBC at 3.9k, HGB at 11.7.  On review of systems, pt reports occasional ankle swelling, some wheezing, some weight gain, stable lower back pains, and denies chest pain, shortness of breath, and any other symptoms.   On PMHx the pt reports PE, Grave's disease and radioactive iodine treatment, and denies any other blood clots. On Social Hx the pt denies ever smoking.  On Family Hx the pt reports maternal stroke, and siblings who take blood thinners, grandfather prostate cancer, grandmother stomach cancer, and denies blood disorders.   Interval History:   Beth Potts returns today regarding her pulmonary embolism. The patient's last visit with [place] was on 11/19/17. The pt reports that she is doing well overall.   The pt reports that she has not had any concerns in the interim.   The pt notes that she did not hold her Xarelto for the repeated lupus anticoagulant lab on 02/19/18.  Lab results (02/19/18) of CBC w/diff, BMP, and Reticulocytes is as follows: all values are WNL except for WBC at 3.6k, RDW at 14.7, Lymphs abs at 800, Glucose at 140, Creatinine at 1.10. 02/19/18 Lupus anticoagulant panel revealed DRVVT elevated at 100.2 02/19/18 dRVVT Mix at 70.3  and dRVVT Confirm at 1.7  On review of systems, pt reports good energy levels, and denies fevers, chills, night sweats, and any other symptoms.    MEDICAL HISTORY:  Past Medical History:  Diagnosis Date  . Hyperlipidemia   . Hypertension   . Leukopenia 02/07/2015  . Thyroid disease   . Varicose veins     SURGICAL HISTORY: Past Surgical History:  Procedure Laterality Date  . JOINT REPLACEMENT Right    knee  . ROTATOR CUFF REPAIR Bilateral     SOCIAL HISTORY: Social History   Socioeconomic History  . Marital status: Widowed    Spouse name: Not on file  . Number of  children: Not on file  . Years of education: Not on file  . Highest education level: Not on file  Occupational History  . Not on file  Social Needs  . Financial resource strain: Not on file  . Food insecurity:    Worry: Not on file    Inability: Not on file  . Transportation needs:    Medical: Not on file    Non-medical: Not on file  Tobacco Use  . Smoking status: Never Smoker  . Smokeless tobacco: Never Used  Substance and Sexual Activity  . Alcohol use: No    Alcohol/week: 0.0 standard drinks  . Drug use: No  . Sexual activity: Not on file  Lifestyle  . Physical activity:    Days per week: Not on file    Minutes per session: Not on file  . Stress: Not on file  Relationships  . Social connections:    Talks on phone: Not on file    Gets together: Not on file    Attends religious service: Not on file    Active member of club or organization: Not on file    Attends meetings of clubs or organizations: Not on file    Relationship status: Not on file  . Intimate partner violence:    Fear of current or ex partner: Not on file    Emotionally abused: Not on file    Physically abused: Not on file    Forced sexual activity: Not on file  Other Topics Concern  . Not on file  Social History Narrative  . Not on file    FAMILY HISTORY: Family History  Problem Relation Age of Onset  . Heart disease Mother   . Stroke Mother   . Heart disease Father     ALLERGIES:  has No Known Allergies.  MEDICATIONS:  Current Outpatient Medications  Medication Sig Dispense Refill  . albuterol (PROAIR HFA) 108 (90 Base) MCG/ACT inhaler Take 1-2 puffs every 6 (six) hours as needed by mouth for shortness of breath.    Marland Kitchen albuterol (PROVENTIL) (2.5 MG/3ML) 0.083% nebulizer solution Inhale 3 mLs (2.5 mg total) 2 (two) times daily into the lungs. 75 mL 12  . amitriptyline (ELAVIL) 25 MG tablet Take 1 tablet (25 mg total) by mouth at bedtime. (Patient not taking: Reported on 03/26/2017) 30 tablet 2   . Ergocalciferol (VITAMIN D2) 2000 units TABS Take 2 capsules daily by mouth.    . fluticasone (FLONASE) 50 MCG/ACT nasal spray Place 1 spray at bedtime into both nostrils.    . folic acid (FOLVITE) 1 MG tablet Take 1 mg by mouth daily.    Marland Kitchen levothyroxine (SYNTHROID, LEVOTHROID) 112 MCG tablet Take 112 mcg by mouth daily before breakfast.    . LORazepam (ATIVAN) 0.5 MG tablet Take 0.5 mg by mouth at bedtime  as needed for anxiety or sleep. Reported on 11/28/2015    . MAGNESIUM PO Take 1 tablet by mouth daily.    . meloxicam (MOBIC) 15 MG tablet Take 1 tablet (15 mg total) by mouth daily. (Patient not taking: Reported on 11/19/2017) 30 tablet 2  . metoprolol succinate (TOPROL XL) 25 MG 24 hr tablet Take 25 mg 2 (two) times daily by mouth.    . montelukast (SINGULAIR) 10 MG tablet Take 10 mg by mouth at bedtime.     Marland Kitchen olmesartan (BENICAR) 20 MG tablet Take 10 mg by mouth daily.     Marland Kitchen omeprazole (PRILOSEC) 20 MG capsule Take 20 mg daily by mouth.    . Rivaroxaban (XARELTO) 15 MG TABS tablet Take 1 tablet (15 mg total) 2 (two) times daily with a meal for 21 days by mouth. 42 tablet 0  . rivaroxaban (XARELTO) 20 MG TABS tablet Take 1 tablet (20 mg total) daily with supper by mouth. 30 tablet 1  . traMADol (ULTRAM) 50 MG tablet Take 1 tablet (50 mg total) every 6 (six) hours as needed by mouth for moderate pain. (Patient not taking: Reported on 02/26/2018) 10 tablet 0  . TURMERIC PO Take 1 Dose daily by mouth.     No current facility-administered medications for this visit.     REVIEW OF SYSTEMS:    A 10+ POINT REVIEW OF SYSTEMS WAS OBTAINED including neurology, dermatology, psychiatry, cardiac, respiratory, lymph, extremities, GI, GU, Musculoskeletal, constitutional, breasts, reproductive, HEENT.  All pertinent positives are noted in the HPI.  All others are negative.   PHYSICAL EXAMINATION:  . Vitals:   02/26/18 1055  BP: 125/72  Pulse: 64  Resp: 18  Temp: 97.7 F (36.5 C)  SpO2: 100%    Filed Weights   02/26/18 1055  Weight: 224 lb 3.2 oz (101.7 kg)   .Body mass index is 36.19 kg/m.  GENERAL:alert, in no acute distress and comfortable SKIN: no acute rashes, no significant lesions EYES: conjunctiva are pink and non-injected, sclera anicteric OROPHARYNX: MMM, no exudates, no oropharyngeal erythema or ulceration NECK: supple, no JVD LYMPH:  no palpable lymphadenopathy in the cervical, axillary or inguinal regions LUNGS: clear to auscultation b/l with normal respiratory effort HEART: regular rate & rhythm ABDOMEN:  normoactive bowel sounds , non tender, not distended. No palpable hepatosplenomegaly.  Extremity: no pedal edema PSYCH: alert & oriented x 3 with fluent speech NEURO: no focal motor/sensory deficits   LABORATORY DATA:  I have reviewed the data as listed  . CBC Latest Ref Rng & Units 02/19/2018 10/29/2017 03/28/2017  WBC 3.9 - 10.3 K/uL 3.6(L) 3.7(L) 3.9(L)  Hemoglobin 11.6 - 15.9 g/dL 11.8 11.1(L) 11.7(L)  Hematocrit 34.8 - 46.6 % 36.6 34.2(L) 36.7  Platelets 145 - 400 K/uL 207 217 166    . CMP Latest Ref Rng & Units 02/19/2018 10/29/2017 03/27/2017  Glucose 70 - 99 mg/dL 140(H) 83 90  BUN 8 - 23 mg/dL 13 14 11   Creatinine 0.44 - 1.00 mg/dL 1.10(H) 1.01 0.92  Sodium 135 - 145 mmol/L 139 139 141  Potassium 3.5 - 5.1 mmol/L 3.9 4.2 4.0  Chloride 98 - 111 mmol/L 104 105 108  CO2 22 - 32 mmol/L 24 26 25   Calcium 8.9 - 10.3 mg/dL 9.6 9.6 9.0  Total Protein 6.4 - 8.3 g/dL - 7.7 -  Total Bilirubin 0.2 - 1.2 mg/dL - 0.4 -  Alkaline Phos 40 - 150 U/L - 88 -  AST 5 - 34 U/L - 20 -  ALT 0 - 55 U/L - 12 -   Component     Latest Ref Rng & Units 10/29/2017  Beta-2 Glycoprotein I Ab, IgG     0 - 20 GPI IgG units <9  Beta-2-Glycoprotein I IgM     0 - 32 GPI IgM units <9  Beta-2-Glycoprotein I IgA     0 - 25 GPI IgA units <9  Anticardiolipin Ab,IgG,Qn     0 - 14 GPL U/mL <9  Anticardiolipin Ab,IgM,Qn     0 - 12 MPL U/mL <9  Anticardiolipin Ab,IgA,Qn      0 - 11 APL U/mL <9  PTT Lupus Anticoagulant     0.0 - 51.9 sec 48.6  DRVVT     0.0 - 47.0 sec 124.6 (H)  Lupus Anticoag Interp      Comment:  Recommendations-PTGENE:      Comment  Recommendations-F5LEID:      Comment  D-Dimer, Quant     0.00 - 0.50 ug/mL-FEU 0.46   Lupus anticoagulant panel      The value has a corrected status.      No reference range information available      Resulting Lab: [facility]      Comments:           (NOTE)           Results are consistent with the presence of a lupus            Anticoagulant.  Factor 5 leiden      No reference range information available      Resulting Lab: [facility]      Comments:           (NOTE)           Result:  Negative (no mutation found)  Prothrombin gene mutation      No reference range information available      Resulting Lab: [facility]      Comments:           (NOTE)           NEGATIVE           No mutation identified.  RADIOGRAPHIC STUDIES: I have personally reviewed the radiological images as listed and agreed with the findings in the report. No results found.  ASSESSMENT & PLAN:   67 y.o. female with  1. Pulmonary Embolism - small PE. No evidence of overt DVT Pt seems to have some provoking causes for her small PE including an infection at the time and long-distance travel, varicose veins and obesity. .Body mass index is 36.19 kg/m.  No FHx h/o VTE  2. Lupus anticoagulant Positive (?true -- will need to evaluation for persistence of ab. Could also be false positive from Xarelto)  PLAN: -10/29/17 Antiphospholipid antibodies were negative except for the Lupus anticoagulant which was noted to be present. -FVL and PGM neg -If dRVVT is not a false positive and is persistent on recheck 12 weeks later and off Xarelto, this would be indicative of the need to be on long term blood thinners for an acquired thrombophilia. -Continue using graded  compression socks -Continue with age-appropriate cancer screening with PCP -Discussed pt labwork from 02/19/18; blood counts and chemistries are stable  -Discussed that the pt's repeated Lupus anticoagulant was positive, however she was not able to hold Xarelto at repeated testing -Discussed that the pt should hold Xarelto for 3 days and then repeat Lupus anticoagulant  test next week    2.  Patient Active Problem List   Diagnosis Date Noted  . Pulmonary emboli ([place]) 03/26/2017  . Spider veins of both lower extremities 11/28/2015  . Leukopenia 02/07/2015  -f/u with PCP   Labs on Thursday 10/17 RTC with Dr [name] as needed     All of the patients questions were answered with apparent satisfaction. The patient knows to call the clinic with any problems, questions or concerns.  The total time spent in the appt was 20 minutes and more than 50% was on counseling and direct patient cares.     Sullivan Lone MD MS AAHIVMS [facility] Hematology/Oncology Physician [facility]  (Office):       [phone] (Work cell):  [phone] (Fax):           [phone]  02/26/2018 11:54 AM  I, Baldwin [place], am acting as a scribe for Dr. [name]  .I have reviewed the above documentation for accuracy and completeness, and I agree with the above. Brunetta Genera MD

## 2018-02-27 ENCOUNTER — Telehealth: Payer: Self-pay

## 2018-02-27 NOTE — Telephone Encounter (Signed)
Spoke with patient concerning her upcoming appointment. Per 10/9 los

## 2018-03-06 ENCOUNTER — Inpatient Hospital Stay: Payer: Medicare Other

## 2018-03-06 DIAGNOSIS — I2699 Other pulmonary embolism without acute cor pulmonale: Secondary | ICD-10-CM

## 2018-03-06 DIAGNOSIS — R760 Raised antibody titer: Secondary | ICD-10-CM

## 2018-03-06 DIAGNOSIS — D6859 Other primary thrombophilia: Secondary | ICD-10-CM

## 2018-03-06 DIAGNOSIS — Z7901 Long term (current) use of anticoagulants: Secondary | ICD-10-CM | POA: Diagnosis not present

## 2018-03-07 LAB — LUPUS ANTICOAGULANT PANEL
DRVVT: 31.7 s (ref 0.0–47.0)
PTT LA: 29.1 s (ref 0.0–51.9)

## 2018-03-27 DIAGNOSIS — M7060 Trochanteric bursitis, unspecified hip: Secondary | ICD-10-CM | POA: Diagnosis not present

## 2018-03-31 ENCOUNTER — Telehealth: Payer: Self-pay

## 2018-03-31 ENCOUNTER — Telehealth: Payer: Self-pay | Admitting: Hematology

## 2018-03-31 NOTE — Telephone Encounter (Signed)
Per 11/11 patient walk in. Patient requesting [name] to discuss her  lab results at his return. Please call patient [phone]

## 2018-03-31 NOTE — Telephone Encounter (Signed)
Returned call to patient re message left to reschedule October appointment. Patient did not miss her October follow. Per 10/9 los patient was to have a lab appointment, which was completed and to return as needed. Left message for patient and asked that she call office to speak with nursing if she is requesting an appointment re concerns/issues she may be having.

## 2018-04-24 DIAGNOSIS — I10 Essential (primary) hypertension: Secondary | ICD-10-CM | POA: Diagnosis not present

## 2018-04-24 DIAGNOSIS — R7309 Other abnormal glucose: Secondary | ICD-10-CM | POA: Diagnosis not present

## 2018-04-24 DIAGNOSIS — Z1211 Encounter for screening for malignant neoplasm of colon: Secondary | ICD-10-CM | POA: Diagnosis not present

## 2018-04-24 DIAGNOSIS — E039 Hypothyroidism, unspecified: Secondary | ICD-10-CM | POA: Diagnosis not present

## 2018-04-24 DIAGNOSIS — Z86711 Personal history of pulmonary embolism: Secondary | ICD-10-CM | POA: Diagnosis not present

## 2018-04-24 DIAGNOSIS — J019 Acute sinusitis, unspecified: Secondary | ICD-10-CM | POA: Diagnosis not present

## 2018-05-05 ENCOUNTER — Telehealth: Payer: Self-pay | Admitting: *Deleted

## 2018-05-05 NOTE — Telephone Encounter (Signed)
Per Dr. [name] - inform patient that lupus antibodies negative 10/17. Patient acknowledged information and verbalized understanding. She wanted to know if she should continue xarelto?

## 2018-05-05 NOTE — Telephone Encounter (Signed)
Patient left VM - requested results of Lupus anticoagulant completed 03/06/18.

## 2018-05-06 ENCOUNTER — Telehealth: Payer: Self-pay | Admitting: *Deleted

## 2018-05-06 ENCOUNTER — Other Ambulatory Visit: Payer: Self-pay | Admitting: *Deleted

## 2018-05-06 NOTE — Telephone Encounter (Signed)
error 

## 2018-06-20 DIAGNOSIS — Z23 Encounter for immunization: Secondary | ICD-10-CM | POA: Diagnosis not present

## 2018-06-27 DIAGNOSIS — I10 Essential (primary) hypertension: Secondary | ICD-10-CM | POA: Insufficient documentation

## 2018-06-27 DIAGNOSIS — J029 Acute pharyngitis, unspecified: Secondary | ICD-10-CM | POA: Diagnosis not present

## 2018-06-27 DIAGNOSIS — E039 Hypothyroidism, unspecified: Secondary | ICD-10-CM | POA: Insufficient documentation

## 2018-06-27 DIAGNOSIS — M179 Osteoarthritis of knee, unspecified: Secondary | ICD-10-CM | POA: Diagnosis not present

## 2018-06-27 DIAGNOSIS — J209 Acute bronchitis, unspecified: Secondary | ICD-10-CM | POA: Diagnosis not present

## 2018-07-21 DIAGNOSIS — H25812 Combined forms of age-related cataract, left eye: Secondary | ICD-10-CM | POA: Diagnosis not present

## 2018-10-01 DIAGNOSIS — H2511 Age-related nuclear cataract, right eye: Secondary | ICD-10-CM | POA: Diagnosis not present

## 2018-10-01 DIAGNOSIS — H25811 Combined forms of age-related cataract, right eye: Secondary | ICD-10-CM | POA: Diagnosis not present

## 2018-10-01 DIAGNOSIS — Z01818 Encounter for other preprocedural examination: Secondary | ICD-10-CM | POA: Diagnosis not present

## 2018-10-20 DIAGNOSIS — E669 Obesity, unspecified: Secondary | ICD-10-CM | POA: Diagnosis not present

## 2018-10-20 DIAGNOSIS — Z86711 Personal history of pulmonary embolism: Secondary | ICD-10-CM | POA: Diagnosis not present

## 2018-10-20 DIAGNOSIS — I10 Essential (primary) hypertension: Secondary | ICD-10-CM | POA: Diagnosis not present

## 2018-10-20 DIAGNOSIS — M2550 Pain in unspecified joint: Secondary | ICD-10-CM | POA: Diagnosis not present

## 2018-10-20 DIAGNOSIS — J309 Allergic rhinitis, unspecified: Secondary | ICD-10-CM | POA: Diagnosis not present

## 2018-10-20 DIAGNOSIS — Z0000 Encounter for general adult medical examination without abnormal findings: Secondary | ICD-10-CM | POA: Diagnosis not present

## 2018-10-20 DIAGNOSIS — K219 Gastro-esophageal reflux disease without esophagitis: Secondary | ICD-10-CM | POA: Diagnosis not present

## 2018-10-20 DIAGNOSIS — Z23 Encounter for immunization: Secondary | ICD-10-CM | POA: Diagnosis not present

## 2018-10-20 DIAGNOSIS — F411 Generalized anxiety disorder: Secondary | ICD-10-CM | POA: Diagnosis not present

## 2018-10-20 DIAGNOSIS — E039 Hypothyroidism, unspecified: Secondary | ICD-10-CM | POA: Diagnosis not present

## 2018-10-20 DIAGNOSIS — E785 Hyperlipidemia, unspecified: Secondary | ICD-10-CM | POA: Diagnosis not present

## 2018-10-20 DIAGNOSIS — R7309 Other abnormal glucose: Secondary | ICD-10-CM | POA: Diagnosis not present

## 2018-11-17 DIAGNOSIS — H2512 Age-related nuclear cataract, left eye: Secondary | ICD-10-CM | POA: Diagnosis not present

## 2018-11-17 DIAGNOSIS — H25812 Combined forms of age-related cataract, left eye: Secondary | ICD-10-CM | POA: Diagnosis not present

## 2019-02-13 DIAGNOSIS — Z23 Encounter for immunization: Secondary | ICD-10-CM | POA: Diagnosis not present

## 2019-02-20 ENCOUNTER — Other Ambulatory Visit: Payer: Self-pay | Admitting: Family Medicine

## 2019-02-20 DIAGNOSIS — Z1231 Encounter for screening mammogram for malignant neoplasm of breast: Secondary | ICD-10-CM

## 2019-03-26 DIAGNOSIS — E039 Hypothyroidism, unspecified: Secondary | ICD-10-CM | POA: Diagnosis not present

## 2019-03-26 DIAGNOSIS — J45909 Unspecified asthma, uncomplicated: Secondary | ICD-10-CM | POA: Diagnosis not present

## 2019-03-26 DIAGNOSIS — E669 Obesity, unspecified: Secondary | ICD-10-CM | POA: Diagnosis not present

## 2019-03-26 DIAGNOSIS — I10 Essential (primary) hypertension: Secondary | ICD-10-CM | POA: Diagnosis not present

## 2019-03-26 DIAGNOSIS — R7309 Other abnormal glucose: Secondary | ICD-10-CM | POA: Diagnosis not present

## 2019-04-08 ENCOUNTER — Ambulatory Visit: Payer: Medicare Other

## 2019-05-26 ENCOUNTER — Emergency Department (HOSPITAL_COMMUNITY)
Admission: EM | Admit: 2019-05-26 | Discharge: 2019-05-26 | Disposition: A | Discharge status: Home / Self Care | Payer: Medicare Other | Attending: Emergency Medicine | Admitting: Emergency Medicine

## 2019-05-26 ENCOUNTER — Other Ambulatory Visit: Payer: Self-pay

## 2019-05-26 ENCOUNTER — Encounter (HOSPITAL_COMMUNITY): Payer: Self-pay

## 2019-05-26 DIAGNOSIS — S61011A Laceration without foreign body of right thumb without damage to nail, initial encounter: Secondary | ICD-10-CM | POA: Insufficient documentation

## 2019-05-26 DIAGNOSIS — Z7901 Long term (current) use of anticoagulants: Secondary | ICD-10-CM | POA: Insufficient documentation

## 2019-05-26 DIAGNOSIS — I10 Essential (primary) hypertension: Secondary | ICD-10-CM | POA: Insufficient documentation

## 2019-05-26 DIAGNOSIS — Y929 Unspecified place or not applicable: Secondary | ICD-10-CM | POA: Diagnosis not present

## 2019-05-26 DIAGNOSIS — W268XXA Contact with other sharp object(s), not elsewhere classified, initial encounter: Secondary | ICD-10-CM | POA: Diagnosis not present

## 2019-05-26 DIAGNOSIS — Y939 Activity, unspecified: Secondary | ICD-10-CM | POA: Insufficient documentation

## 2019-05-26 DIAGNOSIS — Z96651 Presence of right artificial knee joint: Secondary | ICD-10-CM | POA: Insufficient documentation

## 2019-05-26 DIAGNOSIS — Z79899 Other long term (current) drug therapy: Secondary | ICD-10-CM | POA: Insufficient documentation

## 2019-05-26 DIAGNOSIS — Y999 Unspecified external cause status: Secondary | ICD-10-CM | POA: Insufficient documentation

## 2019-05-26 DIAGNOSIS — R531 Weakness: Secondary | ICD-10-CM | POA: Diagnosis not present

## 2019-05-26 DIAGNOSIS — Z23 Encounter for immunization: Secondary | ICD-10-CM | POA: Diagnosis not present

## 2019-05-26 DIAGNOSIS — S6991XA Unspecified injury of right wrist, hand and finger(s), initial encounter: Secondary | ICD-10-CM | POA: Diagnosis present

## 2019-05-26 MED ORDER — HYDROCODONE-ACETAMINOPHEN 5-325 MG PO TABS: 1.0000 | ORAL_TABLET | Freq: Four times a day (QID) | ORAL | 0 refills | Status: AC | PRN

## 2019-05-26 MED ORDER — LIDOCAINE HCL 2 % IJ SOLN
5.0000 mL | Freq: Once | INTRAMUSCULAR | Status: AC
  Administered 2019-05-26: 20:00:00 100 mg
  Filled 2019-05-26: qty 20

## 2019-05-26 MED ORDER — TETANUS-DIPHTH-ACELL PERTUSSIS 5-2.5-18.5 LF-MCG/0.5 IM SUSP
0.5000 mL | Freq: Once | INTRAMUSCULAR | Status: AC
  Administered 2019-05-26: 0.5 mL via INTRAMUSCULAR
  Filled 2019-05-26: qty 0.5

## 2019-05-26 NOTE — ED Notes (Signed)
An After Visit Summary was printed and given to the patient. Discharge instructions given and no further questions at this time.  

## 2019-05-26 NOTE — ED Provider Notes (Signed)
[facility] DEPT Provider Note   CSN: [id number] Arrival date & time: 05/26/19  1727     History Chief Complaint  Patient presents with  . Laceration    Beth Potts is a 69 y.o. female.  HPI Patient presents with laceration to right thumb.  States she was attempting to open a can and the lid cut her.  Reportedly had a fair amount of bleeding but does not feel lightheaded or dizzy.  She is on aspirin.  Previously had been on anticoagulation for pulmonary emboli.  States the thumb is numb.  States she cannot move it.  Last tetanus 10 years ago.    Past Medical History:  Diagnosis Date  . Hyperlipidemia   . Hypertension   . Leukopenia 02/07/2015  . Thyroid disease   . Varicose veins     Patient Active Problem List   Diagnosis Date Noted  . Pulmonary emboli ([place]) 03/26/2017  . Spider veins of both lower extremities 11/28/2015  . Leukopenia 02/07/2015    Past Surgical History:  Procedure Laterality Date  . JOINT REPLACEMENT Right    knee  . ROTATOR CUFF REPAIR Bilateral      OB History   No obstetric history on file.     Family History  Problem Relation Age of Onset  . Heart disease Mother   . Stroke Mother   . Heart disease Father     Social History   Tobacco Use  . Smoking status: Never Smoker  . Smokeless tobacco: Never Used  Substance Use Topics  . Alcohol use: No    Alcohol/week: 0.0 standard drinks  . Drug use: No    Home Medications Prior to Admission medications   Medication Sig Start Date End Date Taking? Authorizing Provider  albuterol (PROAIR HFA) 108 (90 Base) MCG/ACT inhaler Take 1-2 puffs every 6 (six) hours as needed by mouth for shortness of breath.    [provider]  albuterol (PROVENTIL) (2.5 MG/3ML) 0.083% nebulizer solution Inhale 3 mLs (2.5 mg total) 2 (two) times daily into the lungs. 03/28/17   Hosie Poisson, MD  amitriptyline (ELAVIL) 25 MG tablet Take 1 tablet (25 mg total) by mouth at  bedtime. Patient not taking: Reported on 03/26/2017 04/28/14   Bronson Ing, DPM  Ergocalciferol (VITAMIN D2) 2000 units TABS Take 2 capsules daily by mouth.    [provider]  fluticasone (FLONASE) 50 MCG/ACT nasal spray Place 1 spray at bedtime into both nostrils. 02/14/17   [provider]  folic acid (FOLVITE) 1 MG tablet Take 1 mg by mouth daily.    [provider]  HYDROcodone-acetaminophen (NORCO/VICODIN) 5-325 MG tablet Take 1-2 tablets by mouth every 6 (six) hours as needed. 05/26/19   Davonna Belling, MD  levothyroxine (SYNTHROID, LEVOTHROID) 112 MCG tablet Take 112 mcg by mouth daily before breakfast.    [provider]  LORazepam (ATIVAN) 0.5 MG tablet Take 0.5 mg by mouth at bedtime as needed for anxiety or sleep. Reported on 11/28/2015    [provider]  MAGNESIUM PO Take 1 tablet by mouth daily.    [provider]  meloxicam (MOBIC) 15 MG tablet Take 1 tablet (15 mg total) by mouth daily. Patient not taking: Reported on 11/19/2017 11/25/13   Bronson Ing, DPM  metoprolol succinate (TOPROL XL) 25 MG 24 hr tablet Take 25 mg 2 (two) times daily by mouth.    [provider]  montelukast (SINGULAIR) 10 MG tablet Take 10 mg  by mouth at bedtime.     [provider]  olmesartan (BENICAR) 20 MG tablet Take 10 mg by mouth daily.     [provider]  omeprazole (PRILOSEC) 20 MG capsule Take 20 mg daily by mouth.    [provider]  Rivaroxaban (XARELTO) 15 MG TABS tablet Take 1 tablet (15 mg total) 2 (two) times daily with a meal for 21 days by mouth. 03/28/17 04/18/17  Hosie Poisson, MD  rivaroxaban (XARELTO) 20 MG TABS tablet Take 1 tablet (20 mg total) daily with supper by mouth. 04/19/17   Hosie Poisson, MD  traMADol (ULTRAM) 50 MG tablet Take 1 tablet (50 mg total) every 6 (six) hours as needed by mouth for moderate pain. Patient not taking: Reported on 02/26/2018 03/28/17   Hosie Poisson, MD   TURMERIC PO Take 1 Dose daily by mouth.    [provider]    Allergies    Patient has no known allergies.  Review of Systems   Review of Systems  Constitutional: Negative for appetite change.  Skin: Positive for wound.  Neurological: Positive for weakness and numbness.    Physical Exam Updated Vital Signs BP 131/70   Pulse 70   Temp 98.7 F (37.1 C) (Oral)   Resp 18   Ht 5\' 6"  (1.676 m)   Wt 108 kg   SpO2 99%   BMI 38.41 kg/m   Physical Exam Vitals and nursing note reviewed.  Cardiovascular:     Rate and Rhythm: Regular rhythm.  Musculoskeletal:     Comments: Approximately 1 cm horizontal laceration across the proximal phalanx of the right thumb.  Cannot or will not flex the thumb at the IP joint or the MCP joint.  States she is numb distal to the laceration.  Neurological:     Mental Status: She is alert.       ED Results / Procedures / Treatments   Labs (all labs ordered are listed, but only abnormal results are displayed) Labs Reviewed - No data to display  EKG None  Radiology No results found.  Procedures .Marland KitchenLaceration Repair  Date/Time: 05/26/2019 11:43 PM Performed by: Davonna Belling, MD Authorized by: Davonna Belling, MD   Consent:    Consent obtained:  Verbal   Consent given by:  Patient   Risks discussed:  Infection, pain, retained foreign body and tendon damage   Alternatives discussed:  No treatment and delayed treatment Anesthesia (see MAR for exact dosages):    Anesthesia method:  Nerve block   Block needle gauge:  25 G   Block anesthetic:  Lidocaine 2% w/o epi   Block technique:  Digital block   Block injection procedure:  Anatomic landmarks identified, introduced needle, incremental injection, anatomic landmarks palpated and negative aspiration for blood   Block outcome:  Anesthesia achieved Laceration details:    Location:  Finger   Length (cm):  2 Repair type:    Repair type:  Simple Pre-procedure details:     Preparation:  Patient was prepped and draped in usual sterile fashion Exploration:    Hemostasis achieved with:  Direct pressure   Contaminated: no   Treatment:    Area cleansed with:  Saline   Amount of cleaning:  Standard   Irrigation method:  Syringe   Visualized foreign bodies/material removed: no   Skin repair:    Repair method:  Sutures   Suture size:  3-0   Suture material:  Prolene   Number of sutures:  5 Approximation:  Approximation:  Close Post-procedure details:    Dressing:  Sterile dressing   Patient tolerance of procedure:  Tolerated well, no immediate complications   (including critical care time)  Medications Ordered in ED Medications  Tdap (BOOSTRIX) injection 0.5 mL (0.5 mLs Intramuscular Given 05/26/19 2011)  lidocaine (XYLOCAINE) 2 % (with pres) injection 100 mg (100 mg Infiltration Given 05/26/19 2011)    ED Course  I have reviewed the triage vital signs and the nursing notes.  Pertinent labs & imaging results that were available during my care of the patient were reviewed by me and considered in my medical decision making (see chart for details).    MDM Rules/Calculators/A&P                      Patient with thumb laceration.  However will not flex at the IP joint.  Potentially flexor tendon injury.  Discussed with Dr. Amedeo Plenty, will see in the office in follow-up in 2 days with likely surgery after that.  Skin closed.  Tetanus updated.  Discharge Final Clinical Impression(s) / ED Diagnoses Final diagnoses:  Laceration of right thumb without foreign body, nail damage status unspecified, initial encounter    Rx / DC Orders ED Discharge Orders         Ordered    HYDROcodone-acetaminophen (NORCO/VICODIN) 5-325 MG tablet  Every 6 hours PRN     05/26/19 2123           Davonna Belling, MD 05/26/19 2344

## 2019-05-26 NOTE — ED Triage Notes (Signed)
Pt reports she was opening a can and sliced her right thumb open. Bleeding has slowed down but not completely. Pressure dressing applied in triage.

## 2019-05-26 NOTE — Discharge Instructions (Signed)
Follow-up with Dr. Amedeo Plenty on Thursday at 10 AM

## 2019-05-27 ENCOUNTER — Ambulatory Visit: Payer: Medicare Other

## 2019-05-28 DIAGNOSIS — M79644 Pain in right finger(s): Secondary | ICD-10-CM | POA: Insufficient documentation

## 2019-05-28 DIAGNOSIS — S61011A Laceration without foreign body of right thumb without damage to nail, initial encounter: Secondary | ICD-10-CM | POA: Diagnosis not present

## 2019-05-29 DIAGNOSIS — S61011A Laceration without foreign body of right thumb without damage to nail, initial encounter: Secondary | ICD-10-CM | POA: Diagnosis not present

## 2019-05-29 DIAGNOSIS — S66021A Laceration of long flexor muscle, fascia and tendon of right thumb at wrist and hand level, initial encounter: Secondary | ICD-10-CM | POA: Diagnosis not present

## 2019-05-29 DIAGNOSIS — Y999 Unspecified external cause status: Secondary | ICD-10-CM | POA: Diagnosis not present

## 2019-05-29 DIAGNOSIS — X58XXXA Exposure to other specified factors, initial encounter: Secondary | ICD-10-CM | POA: Diagnosis not present

## 2019-05-29 DIAGNOSIS — S66011A Strain of long flexor muscle, fascia and tendon of right thumb at wrist and hand level, initial encounter: Secondary | ICD-10-CM | POA: Diagnosis not present

## 2019-06-02 DIAGNOSIS — K5900 Constipation, unspecified: Secondary | ICD-10-CM | POA: Diagnosis not present

## 2019-06-02 DIAGNOSIS — M549 Dorsalgia, unspecified: Secondary | ICD-10-CM | POA: Diagnosis not present

## 2019-06-02 DIAGNOSIS — S6990XA Unspecified injury of unspecified wrist, hand and finger(s), initial encounter: Secondary | ICD-10-CM | POA: Diagnosis not present

## 2019-06-11 DIAGNOSIS — S61411A Laceration without foreign body of right hand, initial encounter: Secondary | ICD-10-CM | POA: Insufficient documentation

## 2019-06-11 DIAGNOSIS — M25641 Stiffness of right hand, not elsewhere classified: Secondary | ICD-10-CM | POA: Diagnosis not present

## 2019-06-15 DIAGNOSIS — M4310 Spondylolisthesis, site unspecified: Secondary | ICD-10-CM | POA: Insufficient documentation

## 2019-06-15 DIAGNOSIS — M545 Low back pain, unspecified: Secondary | ICD-10-CM | POA: Insufficient documentation

## 2019-06-19 DIAGNOSIS — M545 Low back pain: Secondary | ICD-10-CM | POA: Diagnosis not present

## 2019-06-19 DIAGNOSIS — M25641 Stiffness of right hand, not elsewhere classified: Secondary | ICD-10-CM | POA: Diagnosis not present

## 2019-06-23 DIAGNOSIS — M545 Low back pain: Secondary | ICD-10-CM | POA: Diagnosis not present

## 2019-06-26 DIAGNOSIS — M545 Low back pain: Secondary | ICD-10-CM | POA: Diagnosis not present

## 2019-06-26 DIAGNOSIS — M25641 Stiffness of right hand, not elsewhere classified: Secondary | ICD-10-CM | POA: Diagnosis not present

## 2019-06-29 DIAGNOSIS — M545 Low back pain: Secondary | ICD-10-CM | POA: Diagnosis not present

## 2019-07-02 DIAGNOSIS — M25641 Stiffness of right hand, not elsewhere classified: Secondary | ICD-10-CM | POA: Diagnosis not present

## 2019-07-03 DIAGNOSIS — M545 Low back pain: Secondary | ICD-10-CM | POA: Diagnosis not present

## 2019-07-07 DIAGNOSIS — M545 Low back pain: Secondary | ICD-10-CM | POA: Diagnosis not present

## 2019-07-10 DIAGNOSIS — M25641 Stiffness of right hand, not elsewhere classified: Secondary | ICD-10-CM | POA: Diagnosis not present

## 2019-07-16 DIAGNOSIS — S61411D Laceration without foreign body of right hand, subsequent encounter: Secondary | ICD-10-CM | POA: Diagnosis not present

## 2019-07-16 DIAGNOSIS — M545 Low back pain: Secondary | ICD-10-CM | POA: Diagnosis not present

## 2019-08-17 DIAGNOSIS — M25641 Stiffness of right hand, not elsewhere classified: Secondary | ICD-10-CM | POA: Diagnosis not present

## 2019-08-19 DIAGNOSIS — Z4789 Encounter for other orthopedic aftercare: Secondary | ICD-10-CM | POA: Diagnosis not present

## 2019-08-19 DIAGNOSIS — M79644 Pain in right finger(s): Secondary | ICD-10-CM | POA: Diagnosis not present

## 2019-08-19 DIAGNOSIS — G5601 Carpal tunnel syndrome, right upper limb: Secondary | ICD-10-CM | POA: Diagnosis not present

## 2019-09-01 DIAGNOSIS — M7122 Synovial cyst of popliteal space [Baker], left knee: Secondary | ICD-10-CM | POA: Diagnosis not present

## 2019-09-07 ENCOUNTER — Ambulatory Visit
Admission: RE | Admit: 2019-09-07 | Discharge: 2019-09-07 | Disposition: A | Discharge status: Home / Self Care | Payer: Medicare Other | Source: Ambulatory Visit | Attending: Family Medicine | Admitting: Family Medicine

## 2019-09-07 ENCOUNTER — Other Ambulatory Visit: Payer: Self-pay

## 2019-09-07 DIAGNOSIS — Z1231 Encounter for screening mammogram for malignant neoplasm of breast: Secondary | ICD-10-CM

## 2019-09-09 DIAGNOSIS — Z4789 Encounter for other orthopedic aftercare: Secondary | ICD-10-CM | POA: Diagnosis not present

## 2019-09-09 DIAGNOSIS — M79644 Pain in right finger(s): Secondary | ICD-10-CM | POA: Diagnosis not present

## 2019-09-09 DIAGNOSIS — G5601 Carpal tunnel syndrome, right upper limb: Secondary | ICD-10-CM | POA: Diagnosis not present

## 2020-01-08 DIAGNOSIS — K219 Gastro-esophageal reflux disease without esophagitis: Secondary | ICD-10-CM | POA: Diagnosis not present

## 2020-01-08 DIAGNOSIS — J45909 Unspecified asthma, uncomplicated: Secondary | ICD-10-CM | POA: Diagnosis not present

## 2020-01-08 DIAGNOSIS — E785 Hyperlipidemia, unspecified: Secondary | ICD-10-CM | POA: Diagnosis not present

## 2020-01-08 DIAGNOSIS — E039 Hypothyroidism, unspecified: Secondary | ICD-10-CM | POA: Diagnosis not present

## 2020-01-08 DIAGNOSIS — Z01818 Encounter for other preprocedural examination: Secondary | ICD-10-CM | POA: Diagnosis not present

## 2020-01-08 DIAGNOSIS — I10 Essential (primary) hypertension: Secondary | ICD-10-CM | POA: Diagnosis not present

## 2020-01-08 DIAGNOSIS — R140 Abdominal distension (gaseous): Secondary | ICD-10-CM | POA: Diagnosis not present

## 2020-02-24 DIAGNOSIS — Z23 Encounter for immunization: Secondary | ICD-10-CM | POA: Diagnosis not present

## 2020-03-09 DIAGNOSIS — Z23 Encounter for immunization: Secondary | ICD-10-CM | POA: Diagnosis not present

## 2020-03-20 DIAGNOSIS — R058 Other specified cough: Secondary | ICD-10-CM | POA: Diagnosis not present

## 2020-03-29 DIAGNOSIS — J45901 Unspecified asthma with (acute) exacerbation: Secondary | ICD-10-CM | POA: Diagnosis not present

## 2020-03-29 DIAGNOSIS — J069 Acute upper respiratory infection, unspecified: Secondary | ICD-10-CM | POA: Diagnosis not present

## 2020-04-20 DIAGNOSIS — Z1159 Encounter for screening for other viral diseases: Secondary | ICD-10-CM | POA: Diagnosis not present

## 2020-04-25 DIAGNOSIS — K640 First degree hemorrhoids: Secondary | ICD-10-CM | POA: Diagnosis not present

## 2020-04-25 DIAGNOSIS — Z1211 Encounter for screening for malignant neoplasm of colon: Secondary | ICD-10-CM | POA: Diagnosis not present

## 2020-04-25 DIAGNOSIS — K5730 Diverticulosis of large intestine without perforation or abscess without bleeding: Secondary | ICD-10-CM | POA: Diagnosis not present

## 2020-04-25 DIAGNOSIS — D124 Benign neoplasm of descending colon: Secondary | ICD-10-CM | POA: Diagnosis not present

## 2020-04-27 DIAGNOSIS — D124 Benign neoplasm of descending colon: Secondary | ICD-10-CM | POA: Diagnosis not present

## 2020-06-08 DIAGNOSIS — I10 Essential (primary) hypertension: Secondary | ICD-10-CM | POA: Diagnosis not present

## 2020-06-08 DIAGNOSIS — M25531 Pain in right wrist: Secondary | ICD-10-CM | POA: Diagnosis not present

## 2020-06-08 DIAGNOSIS — M79641 Pain in right hand: Secondary | ICD-10-CM | POA: Diagnosis not present

## 2020-06-15 ENCOUNTER — Other Ambulatory Visit (HOSPITAL_COMMUNITY): Payer: Self-pay | Admitting: Family Medicine

## 2020-06-15 DIAGNOSIS — Z86711 Personal history of pulmonary embolism: Secondary | ICD-10-CM

## 2020-06-15 DIAGNOSIS — M5432 Sciatica, left side: Secondary | ICD-10-CM | POA: Diagnosis not present

## 2020-06-15 DIAGNOSIS — M7989 Other specified soft tissue disorders: Secondary | ICD-10-CM | POA: Diagnosis not present

## 2020-06-16 ENCOUNTER — Ambulatory Visit (HOSPITAL_COMMUNITY)
Admission: RE | Admit: 2020-06-16 | Discharge: 2020-06-16 | Disposition: A | Discharge status: Home / Self Care | Payer: Medicare Other | Source: Ambulatory Visit | Attending: Family Medicine | Admitting: Family Medicine

## 2020-06-16 ENCOUNTER — Other Ambulatory Visit: Payer: Self-pay

## 2020-06-16 DIAGNOSIS — Z86711 Personal history of pulmonary embolism: Secondary | ICD-10-CM | POA: Diagnosis not present

## 2020-06-16 DIAGNOSIS — M7989 Other specified soft tissue disorders: Secondary | ICD-10-CM | POA: Diagnosis not present

## 2020-06-16 NOTE — Progress Notes (Signed)
VASCULAR LAB    Left lower extremity venous duplex has been performed.  See CV proc for preliminary results.   Called report to [facility] at [facility], [facility], RVT 06/16/2020, 8:34 AM

## 2020-06-22 DIAGNOSIS — M25552 Pain in left hip: Secondary | ICD-10-CM | POA: Diagnosis not present

## 2020-06-22 DIAGNOSIS — M5442 Lumbago with sciatica, left side: Secondary | ICD-10-CM | POA: Diagnosis not present

## 2020-07-05 DIAGNOSIS — M545 Low back pain, unspecified: Secondary | ICD-10-CM | POA: Diagnosis not present

## 2020-07-05 DIAGNOSIS — M5459 Other low back pain: Secondary | ICD-10-CM | POA: Diagnosis not present

## 2020-07-05 DIAGNOSIS — Z6835 Body mass index (BMI) 35.0-35.9, adult: Secondary | ICD-10-CM | POA: Diagnosis not present

## 2020-07-06 ENCOUNTER — Ambulatory Visit (HOSPITAL_BASED_OUTPATIENT_CLINIC_OR_DEPARTMENT_OTHER): Payer: Medicare Other | Attending: Family Medicine | Admitting: Physical Therapy

## 2020-07-06 ENCOUNTER — Other Ambulatory Visit: Payer: Self-pay

## 2020-07-06 DIAGNOSIS — M25652 Stiffness of left hip, not elsewhere classified: Secondary | ICD-10-CM | POA: Insufficient documentation

## 2020-07-06 DIAGNOSIS — M545 Low back pain, unspecified: Secondary | ICD-10-CM | POA: Diagnosis not present

## 2020-07-06 DIAGNOSIS — M25651 Stiffness of right hip, not elsewhere classified: Secondary | ICD-10-CM | POA: Diagnosis not present

## 2020-07-06 DIAGNOSIS — M6281 Muscle weakness (generalized): Secondary | ICD-10-CM | POA: Diagnosis not present

## 2020-07-06 NOTE — Patient Instructions (Signed)
Date: 07/06/2020 Prepared by: Estill Bamberg April Thurnell Garbe  Exercises Standing 'L' Stretch at Counter - 1 x daily - 7 x weekly - 3 sets - 10 reps Supine Figure 4 Piriformis Stretch - 1 x daily - 7 x weekly - 3 sets - 20-30 sec hold Supine Lower Trunk Rotation - 1 x daily - 7 x weekly - 1 sets - 10 reps

## 2020-07-06 NOTE — Therapy (Signed)
Beth Potts, [place], [address] Phone: [phone]   Fax:  [phone]  Physical Therapy Evaluation  Patient Details  Name: Beth Potts MRN: [id number] Date of Birth: [birth year] Referring Provider (PT): Lujean Amel, MD   Encounter Date: 07/06/2020   PT End of Session - 07/06/20 1447    Visit Number 1    Number of Visits 5    Date for PT Re-Evaluation 08/03/20    Authorization Type Medicare    PT Start Time 4818    PT Stop Time 1530    PT Time Calculation (min) 45 min    Activity Tolerance Patient tolerated treatment well    Behavior During Therapy [facility] for tasks assessed/performed           Past Medical History:  Diagnosis Date  . Hyperlipidemia   . Hypertension   . Leukopenia 02/07/2015  . Thyroid disease   . Varicose veins     Past Surgical History:  Procedure Laterality Date  . JOINT REPLACEMENT Right    knee  . ROTATOR CUFF REPAIR Bilateral     There were no vitals filed for this visit.    Subjective Assessment - 07/06/20 1448    Subjective Pt reports falling on her right side when she slipped on ice this past Winter. Pt started to get sciatic nerve pain on her left side as well as spraining her right wrist. Pt got xrays and back was normal; pt started on different courses of pain medication. Finally, pt stated gabapentin really helped with her pain and has it mostly resolved. Pt reports continued tightness along whole low back (reports baseline she has some back pain). Most stiff in the morning. She is interested in pursuing PT to learn exercises/stretches to loosen her back and safe transition to a gym/community exercise program for her back.    Pertinent History HTN, asthma, hypothyroidism, hyperlipidemia, GERD    How long can you sit comfortably? n/a    How long can you stand comfortably? n/a    How long can you walk comfortably? n/a    Diagnostic tests Doppler: (-) bilat DVT     Patient Stated Goals Return to PLOF; safe return to the gym    Currently in Pain? No/denies              [facility] PT Assessment - 07/07/20 0001      Assessment   Medical Diagnosis M54.32 (ICD-10-CM) - Sciatica, left side    Referring Provider (PT) Lujean Amel, MD    Onset Date/Surgical Date --   Jan 2022   Prior Therapy None      Balance Screen   Has the patient fallen in the past 6 months Yes    How many times? 1    Has the patient had a decrease in activity level because of a fear of falling?  No    Is the patient reluctant to leave their home because of a fear of falling?  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      Functional Tests   Functional tests Squat;Single leg stance  Squat   Comments 5 reps; feet abducted, knees forward, decreased lumbar lordosis/hip flexibility      Single Leg Stance   Comments L LE 14 sec; R LE 14 sec      Posture/Postural Control   Posture/Postural Control Postural limitations    Postural Limitations Increased lumbar lordosis      AROM   Right Hip Extension 10    Right Hip External Rotation  25    Right Hip Internal Rotation  20    Left Hip Extension 30    Left Hip External Rotation  25    Left Hip Internal Rotation  25    Lumbar Flexion WFL    Lumbar Extension WFL    Lumbar - Right Side Bend WFL    Lumbar - Left Side Bend WFL    Lumbar - Right Rotation WFL    Lumbar - Left Rotation [facility]      Strength   Overall Strength Comments Hip extension (limited on R due to decreased AROM) strength 3-/5, L hip ext 3+/5; L hip abduction 4/5; R hip abduction 5/5      Flexibility   Soft Tissue Assessment /Muscle Length yes    Hamstrings 90 deg on L,  100 deg on R    Quadriceps 80 deg on L, >90 on R    Piriformis Shortened bilaterally while performing FABER      Palpation   Palpation comment No TTP      Special Tests   Hip Special Tests  Saralyn Pilar (FABER) Test;Trendelenberg Test;Thomas Test      Saralyn Pilar [facility]) Test   Findings Positive    Side Right    Comments R hip groin pain, L hip tightness      Trendelenburg Test   Side Left      Thomas Test    Findings Negative    Comments Bilat tight hip flexors      Ambulation/Gait   Assistive device None    Gait Pattern Within Functional Limits                      Objective measurements completed on examination: See above findings.               PT Education - 07/06/20 1735    Education Details Discussed exam findings and POC. Discussed HEP    Person(s) Educated Patient    Methods Explanation;Handout    Comprehension Verbalized understanding            PT Short Term Goals - 07/06/20 1736      PT SHORT TERM GOAL #1   Title STG = LTG             PT Long Term Goals - 07/06/20 1736      PT LONG TERM GOAL #1   Title Pt will be independent with final HEP for transition to gym/community wellness    Time 4    Period Weeks    Status New    Target Date 08/03/20      PT LONG TERM GOAL #2   Title Pt will be able to perform full squat with 25# to demo increased functional strength    Time 4    Period Weeks    Status New    Target Date 08/03/20      PT LONG TERM GOAL #3   Title Pt will demo improved bilat hip ROM to be [facility]    Baseline R hip extension limited  to ~10 deg, ER limited ~30 deg; L hip ER limited ~30 deg    Time 4    Period Weeks    Status New    Target Date 08/03/20      PT LONG TERM GOAL #4   Title Pt will be able to perform SLS for at least 20 sec bilat to demo improved LE stability    Baseline 14 sec bilat    Time 4    Period Weeks    Status New    Target Date 08/04/20                  Plan - 07/06/20 1729     Clinical Impression Statement Ms. Pucci is a 70 y/o F presenting to OPPT due to history of low back pain with sciatica exacerbated by a fall on her R hip last month. Pt is no longer experiencing her excruciating L side sciatica but is interested in PT to reduce risk and prevent any further back injury and improve her strength. On assessment, pt found to have lumbar ROM WFL but decreased hip AROM from decreased hip joint (R worse than L hip) and muscle flexibility (L worse than R) with general weakness. Pt would benefit from PT to optimize her level of function and transition her into a gym/community wellness program per her goals.    Personal Factors and Comorbidities Age    Comorbidities HTN, asthma, hypothyroidism, hyperlipidemia, GERD    Examination-Activity Limitations Squat;Locomotion Level    Examination-Participation Restrictions Occupation;Community Activity    Stability/Clinical Decision Making Stable/Uncomplicated    Clinical Decision Making Low    Rehab Potential Good    PT Frequency 1x / week    PT Duration 4 weeks    PT Treatment/Interventions ADLs/Self Care Home Management;Aquatic Therapy;Moist Heat;Gait training;Stair training;Functional mobility training;Therapeutic activities;Neuromuscular re-education;Balance training;Therapeutic exercise;Patient/family education;Manual techniques;Passive range of motion;Dry needling;Taping;Electrical Stimulation;Iontophoresis 4mg /ml Dexamethasone    PT Next Visit Plan Assess response to HEP. R hip mobilizations, L hip muscle stretches. General hip and core strengthening -- show patient machines as indicated.    PT Home Exercise Plan Access Code [email or web address]    Recommended Other Services Referred to [facility] for membership    Consulted and Agree with Plan of Care Patient           Patient will benefit from skilled therapeutic intervention in order to improve the following deficits and impairments:  Decreased mobility,Hypomobility,Decreased  range of motion,Impaired flexibility,Decreased strength,Decreased balance  Visit Diagnosis: Midline low back pain without sciatica, unspecified chronicity  Stiffness of left hip, not elsewhere classified  Stiffness of right hip, not elsewhere classified  Muscle weakness (generalized)     Problem List Patient Active Problem List   Diagnosis Date Noted  . Pulmonary emboli ([place]) 03/26/2017  . Spider veins of both lower extremities 11/28/2015  . Leukopenia 02/07/2015    Ezequias Lard April Ma L Keigan Tafoya PT, DPT 07/06/2020, 5:39 PM  [facility] [place], [place], [address] Phone: [phone]   Fax:  [phone]  Name: KASONDRA JUNOD MRN: [id number] Date of Birth: 06/11/1950

## 2020-07-07 ENCOUNTER — Encounter (HOSPITAL_BASED_OUTPATIENT_CLINIC_OR_DEPARTMENT_OTHER): Payer: Self-pay | Admitting: Physical Therapy

## 2020-07-13 ENCOUNTER — Ambulatory Visit (HOSPITAL_BASED_OUTPATIENT_CLINIC_OR_DEPARTMENT_OTHER): Payer: Medicare Other | Attending: Family Medicine | Admitting: Physical Therapy

## 2020-07-13 ENCOUNTER — Other Ambulatory Visit: Payer: Self-pay

## 2020-07-13 DIAGNOSIS — M545 Low back pain, unspecified: Secondary | ICD-10-CM | POA: Insufficient documentation

## 2020-07-13 DIAGNOSIS — M25652 Stiffness of left hip, not elsewhere classified: Secondary | ICD-10-CM | POA: Diagnosis not present

## 2020-07-13 DIAGNOSIS — M6281 Muscle weakness (generalized): Secondary | ICD-10-CM | POA: Insufficient documentation

## 2020-07-13 DIAGNOSIS — M25651 Stiffness of right hip, not elsewhere classified: Secondary | ICD-10-CM | POA: Insufficient documentation

## 2020-07-14 NOTE — Therapy (Signed)
[place], [place], [address] Phone: [phone]   Fax:  [phone]  Physical Therapy Treatment  Patient Details  Name: Beth Potts MRN: [id number] Date of Birth: Nov 07, 1950 Referring Provider (Potts): Lujean Amel, MD   Encounter Date: 07/13/2020   Potts End of Session - 07/14/20 0814    Visit Number 2    Number of Visits 5    Date for Potts Re-Evaluation 08/03/20    Authorization Type Medicare    Potts Start Time 1430    Potts Stop Time 1515    Potts Time Calculation (min) 45 min    Activity Tolerance Patient tolerated treatment well    Behavior During Therapy [facility] for tasks assessed/performed           Past Medical History:  Diagnosis Date  . Hyperlipidemia   . Hypertension   . Leukopenia 02/07/2015  . Thyroid disease   . Varicose veins     Past Surgical History:  Procedure Laterality Date  . JOINT REPLACEMENT Right    knee  . ROTATOR CUFF REPAIR Bilateral     There were no vitals filed for this visit.   Subjective Assessment - 07/13/20 1428    Subjective Potts reports no huge issues from previous session. Potts reports doing the stretches just about every day.    Pertinent History HTN, asthma, hypothyroidism, hyperlipidemia, GERD    How long can you sit comfortably? n/a    How long can you stand comfortably? n/a    How long can you walk comfortably? n/a    Diagnostic tests Doppler: (-) bilat DVT    Patient Stated Goals Return to PLOF; safe return to the gym    Currently in Pain? No/denies                   [facility] Adult Potts Treatment/Exercise - 07/14/20 0001      Lumbar Exercises: Stretches   Press Ups 20 seconds;2 reps    Quad Stretch Right;Left;30 seconds    Piriformis Stretch Right;Left;20 seconds    Figure 4 Stretch 2 reps;Seated;20 seconds    Other Lumbar Stretch Exercise Modified downward dog to modified plank on plinth 3x20 sec      Lumbar Exercises: Aerobic   Recumbent Bike  [employer] x5 min      Lumbar Exercises: Sidelying   Hip Abduction Right;Left;20 reps      Lumbar Exercises: Prone   Other Prone Lumbar Exercises Knee bent donkey kick 2x10   Potts fatigues     Manual Therapy   Manual Therapy Joint mobilization    Joint Mobilization R hip grade III mobs for flexion, ER, and extension                    Potts Short Term Goals - 07/06/20 1736      Potts SHORT TERM GOAL #1   Title STG = LTG             Potts Long Term Goals - 07/06/20 1736      Potts LONG TERM GOAL #1   Title Potts will be independent with final HEP for transition to gym/community wellness    Time 4    Period Weeks    Status New    Target Date 08/03/20      Potts LONG TERM GOAL #2   Title Potts will be able to perform full squat with 25# to demo increased functional strength    Time  4    Period Weeks    Status New    Target Date 08/03/20      Potts LONG TERM GOAL #3   Title Potts will demo improved bilat hip ROM to be [facility]    Baseline R hip extension limited to ~10 deg, ER limited ~30 deg; L hip ER limited ~30 deg    Time 4    Period Weeks    Status New    Target Date 08/03/20      Potts LONG TERM GOAL #4   Title Potts will be able to perform SLS for at least 20 sec bilat to demo improved LE stability    Baseline 14 sec bilat    Time 4    Period Weeks    Status New    Target Date 08/04/20                 Plan - 07/14/20 7062    Clinical Impression Statement Treatment session focused on improving L LE flexibility and improving R femoracetabular joint mobility with manual therapy, stretches, and exercises. Initiated strength training of Potts's bilat hips. Potts plans to join [employer].    Personal Factors and Comorbidities Age    Comorbidities HTN, asthma, hypothyroidism, hyperlipidemia, GERD    Examination-Activity Limitations Squat;Locomotion Level    Examination-Participation Restrictions Occupation;Community Activity    Stability/Clinical Decision Making Stable/Uncomplicated     Rehab Potential Good    Potts Frequency 1x / week    Potts Duration 4 weeks    Potts Treatment/Interventions ADLs/Self Care Home Management;Aquatic Therapy;Moist Heat;Gait training;Stair training;Functional mobility training;Therapeutic activities;Neuromuscular re-education;Balance training;Therapeutic exercise;Patient/family education;Manual techniques;Passive range of motion;Dry needling;Taping;Electrical Stimulation;Iontophoresis 4mg /ml Dexamethasone    Potts Next Visit Plan Assess response to HEP. R hip mobilizations, L hip muscle stretches. General hip and core strengthening -- show patient machines as indicated.    Potts Home Exercise Plan Access Code [email or web address]    Recommended Other Services [employer] specialist    Consulted and Agree with Plan of Care Patient           Patient will benefit from skilled therapeutic intervention in order to improve the following deficits and impairments:  Decreased mobility,Hypomobility,Decreased range of motion,Impaired flexibility,Decreased strength,Decreased balance  Visit Diagnosis: Midline low back pain without sciatica, unspecified chronicity  Stiffness of left hip, not elsewhere classified  Stiffness of right hip, not elsewhere classified  Muscle weakness (generalized)     Problem List Patient Active Problem List   Diagnosis Date Noted  . Pulmonary emboli ([place]) 03/26/2017  . Spider veins of both lower extremities 11/28/2015  . Leukopenia 02/07/2015    Beth Potts, DPT 07/14/2020, 8:17 AM  [facility] [place], [place], [address] Phone: [phone]   Fax:  [phone]  Name: Beth Potts MRN: [id number] Date of Birth: 04-05-1951

## 2020-07-20 ENCOUNTER — Other Ambulatory Visit: Payer: Self-pay

## 2020-07-20 ENCOUNTER — Ambulatory Visit (HOSPITAL_BASED_OUTPATIENT_CLINIC_OR_DEPARTMENT_OTHER): Payer: Medicare Other | Attending: Family Medicine | Admitting: Physical Therapy

## 2020-07-20 DIAGNOSIS — M25652 Stiffness of left hip, not elsewhere classified: Secondary | ICD-10-CM | POA: Insufficient documentation

## 2020-07-20 DIAGNOSIS — M25651 Stiffness of right hip, not elsewhere classified: Secondary | ICD-10-CM | POA: Diagnosis not present

## 2020-07-20 DIAGNOSIS — M6281 Muscle weakness (generalized): Secondary | ICD-10-CM | POA: Diagnosis not present

## 2020-07-20 DIAGNOSIS — M545 Low back pain, unspecified: Secondary | ICD-10-CM | POA: Diagnosis not present

## 2020-07-20 NOTE — Therapy (Signed)
[place], [place], [address] Phone: [phone]   Fax:  [phone]  Physical Therapy Treatment  Patient Details  Name: Beth Potts MRN: [id number] Date of Birth: 04/03/1951 Referring Provider (PT): Lujean Amel, MD   Encounter Date: 07/20/2020   PT End of Session - 07/20/20 1545    Visit Number 3    Number of Visits 5    Date for PT Re-Evaluation 08/03/20    Authorization Type Medicare    PT Start Time 1430    PT Stop Time 1515    PT Time Calculation (min) 45 min    Activity Tolerance Patient tolerated treatment well    Behavior During Therapy [facility] for tasks assessed/performed           Past Medical History:  Diagnosis Date  . Hyperlipidemia   . Hypertension   . Leukopenia 02/07/2015  . Thyroid disease   . Varicose veins     Past Surgical History:  Procedure Laterality Date  . JOINT REPLACEMENT Right    knee  . ROTATOR CUFF REPAIR Bilateral     There were no vitals filed for this visit.   Subjective Assessment - 07/20/20 1435    Subjective Pt states she's been out of town to help with her sick brother. Pt has been stretching daily but has not had a chance to try her other strengthening exercises.    Pertinent History HTN, asthma, hypothyroidism, hyperlipidemia, GERD    How long can you sit comfortably? n/a    How long can you stand comfortably? n/a    How long can you walk comfortably? n/a    Diagnostic tests Doppler: (-) bilat DVT    Patient Stated Goals Return to PLOF; safe return to the gym    Currently in Pain? No/denies                             [facility] Adult PT Treatment/Exercise - 07/20/20 0001      Lumbar Exercises: Aerobic   Recumbent Bike Sci Fit x 5 min L 1.7      Lumbar Exercises: Supine   Other Supine Lumbar Exercises Therapy ball hip flex/ext 2x10 with PPT    Other Supine Lumbar Exercises Therapy ball bridge x5      Lumbar Exercises:  Sidelying   Other Sidelying Lumbar Exercises Modified side plank on knees 2x10 sec on L; 2x20 sec on R;      Lumbar Exercises: Prone   Other Prone Lumbar Exercises Front plank on knees 2x20 sec    Other Prone Lumbar Exercises Knee bent donkey kick 2x10      Manual Therapy   Manual Therapy Joint mobilization    Joint Mobilization R hip grade III mobs for flexion, ER, and extension                    PT Short Term Goals - 07/06/20 1736      PT SHORT TERM GOAL #1   Title STG = LTG             PT Long Term Goals - 07/06/20 1736      PT LONG TERM GOAL #1   Title Pt will be independent with final HEP for transition to gym/community wellness    Time 4    Period Weeks    Status New    Target Date 08/03/20      PT  LONG TERM GOAL #2   Title Pt will be able to perform full squat with 25# to demo increased functional strength    Time 4    Period Weeks    Status New    Target Date 08/03/20      PT LONG TERM GOAL #3   Title Pt will demo improved bilat hip ROM to be [facility]    Baseline R hip extension limited to ~10 deg, ER limited ~30 deg; L hip ER limited ~30 deg    Time 4    Period Weeks    Status New    Target Date 08/03/20      PT LONG TERM GOAL #4   Title Pt will be able to perform SLS for at least 20 sec bilat to demo improved LE stability    Baseline 14 sec bilat    Time 4    Period Weeks    Status New    Target Date 08/04/20                 Plan - 07/20/20 1543    Clinical Impression Statement Pt with improving hip flexbility; remains most limited with hip ER. L hip muscles remain more shortened than R hip. Treatment focused on increasing core strengthening -- especially L obliques. Initiated side planking with good patient tolerance.    Personal Factors and Comorbidities Age    Comorbidities HTN, asthma, hypothyroidism, hyperlipidemia, GERD    Examination-Activity Limitations Squat;Locomotion Level    Examination-Participation Restrictions  Occupation;Community Activity    Stability/Clinical Decision Making Stable/Uncomplicated    Rehab Potential Good    PT Frequency 1x / week    PT Duration 4 weeks    PT Treatment/Interventions ADLs/Self Care Home Management;Aquatic Therapy;Moist Heat;Gait training;Stair training;Functional mobility training;Therapeutic activities;Neuromuscular re-education;Balance training;Therapeutic exercise;Patient/family education;Manual techniques;Passive range of motion;Dry needling;Taping;Electrical Stimulation;Iontophoresis 4mg /ml Dexamethasone    PT Next Visit Plan Assess response to HEP. R hip mobilizations, L hip muscle stretches. General hip and core strengthening -- show patient machines as indicated. Progress physioball exercises and side planking    PT Home Exercise Plan Access Code [email or web address]    Consulted and Agree with Plan of Care Patient           Patient will benefit from skilled therapeutic intervention in order to improve the following deficits and impairments:  Decreased mobility,Hypomobility,Decreased range of motion,Impaired flexibility,Decreased strength,Decreased balance  Visit Diagnosis: Midline low back pain without sciatica, unspecified chronicity  Stiffness of left hip, not elsewhere classified  Stiffness of right hip, not elsewhere classified  Muscle weakness (generalized)     Problem List Patient Active Problem List   Diagnosis Date Noted  . Pulmonary emboli ([place]) 03/26/2017  . Spider veins of both lower extremities 11/28/2015  . Leukopenia 02/07/2015    Merlin Ege April Ma L Nayzeth Altman PT, DPT 07/20/2020, 3:47 PM  [facility] [address] [place], [place], [address] Phone: [phone]   Fax:  [phone]  Name: ODESTER NILSON MRN: [id number] Date of Birth: [birth year]

## 2020-07-27 ENCOUNTER — Other Ambulatory Visit: Payer: Self-pay

## 2020-07-27 ENCOUNTER — Ambulatory Visit (HOSPITAL_BASED_OUTPATIENT_CLINIC_OR_DEPARTMENT_OTHER): Payer: Medicare Other | Admitting: Physical Therapy

## 2020-07-27 DIAGNOSIS — M545 Low back pain, unspecified: Secondary | ICD-10-CM

## 2020-07-27 DIAGNOSIS — M6281 Muscle weakness (generalized): Secondary | ICD-10-CM

## 2020-07-27 DIAGNOSIS — M25652 Stiffness of left hip, not elsewhere classified: Secondary | ICD-10-CM

## 2020-07-27 DIAGNOSIS — M25651 Stiffness of right hip, not elsewhere classified: Secondary | ICD-10-CM | POA: Diagnosis not present

## 2020-07-27 NOTE — Therapy (Signed)
[place], [place], [address] Phone: [phone]   Fax:  [phone]  Physical Therapy Treatment  Patient Details  Name: Beth Potts MRN: [id number] Date of Birth: 10/20/1950 Referring Provider (PT): Lujean Amel, MD   Encounter Date: 07/27/2020   PT End of Session - 07/27/20 1445    Visit Number 4    Number of Visits 5    Date for PT Re-Evaluation 08/03/20    Authorization Type Medicare    PT Start Time 1435    PT Stop Time 1515    PT Time Calculation (min) 40 min    Activity Tolerance Patient tolerated treatment well    Behavior During Therapy [facility] for tasks assessed/performed           Past Medical History:  Diagnosis Date  . Hyperlipidemia   . Hypertension   . Leukopenia 02/07/2015  . Thyroid disease   . Varicose veins     Past Surgical History:  Procedure Laterality Date  . JOINT REPLACEMENT Right    knee  . ROTATOR CUFF REPAIR Bilateral     There were no vitals filed for this visit.   Subjective Assessment - 07/27/20 1441    Subjective Pt states she has had no pain. She has not been able to work on the exercises this week as she's had a lot going on. She states she was not able to get the app to work.    Pertinent History HTN, asthma, hypothyroidism, hyperlipidemia, GERD    How long can you sit comfortably? n/a    How long can you stand comfortably? n/a    How long can you walk comfortably? n/a    Diagnostic tests Doppler: (-) bilat DVT    Patient Stated Goals Return to PLOF; safe return to the gym    Currently in Pain? No/denies                             [facility] Adult PT Treatment/Exercise - 07/27/20 0001      Lumbar Exercises: Stretches   Figure 4 Stretch 2 reps;Supine;With overpressure;30 seconds      Lumbar Exercises: Aerobic   Recumbent Bike Sci Fit x 5 min Beth 1      Lumbar Exercises: Supine   Other Supine Lumbar Exercises Therapy ball hip  flex/ext 2x10 with PPT    Other Supine Lumbar Exercises Therapy ball bridge 3x10      Lumbar Exercises: Sidelying   Other Sidelying Lumbar Exercises Modified side plank on knees 3x10 sec on Beth; 3x20 sec on R;      Manual Therapy   Joint Mobilization R hip grade III mobs for flexion, ER, and extension                  PT Education - 07/27/20 1526    Education Details Increased time spent reviewing HEP and how to use medbridge app.    Person(s) Educated Patient    Methods Explanation;Handout    Comprehension Verbalized understanding            PT Short Term Goals - 07/06/20 1736      PT SHORT TERM GOAL #1   Title STG = LTG             PT Long Term Goals - 07/06/20 1736      PT LONG TERM GOAL #1   Title Pt will be independent with final  HEP for transition to gym/community wellness    Time 4    Period Weeks    Status New    Target Date 08/03/20      PT LONG TERM GOAL #2   Title Pt will be able to perform full squat with 25# to demo increased functional strength    Time 4    Period Weeks    Status New    Target Date 08/03/20      PT LONG TERM GOAL #3   Title Pt will demo improved bilat hip ROM to be [facility]    Baseline R hip extension limited to ~10 deg, ER limited ~30 deg; Beth hip ER limited ~30 deg    Time 4    Period Weeks    Status New    Target Date 08/03/20      PT LONG TERM GOAL #4   Title Pt will be able to perform SLS for at least 20 sec bilat to demo improved LE stability    Baseline 14 sec bilat    Time 4    Period Weeks    Status New    Target Date 08/04/20                  Patient will benefit from skilled therapeutic intervention in order to improve the following deficits and impairments:     Visit Diagnosis: Midline low back pain without sciatica, unspecified chronicity  Stiffness of left hip, not elsewhere classified  Stiffness of right hip, not elsewhere classified  Muscle weakness (generalized)     Problem  List Patient Active Problem List   Diagnosis Date Noted  . Pulmonary emboli ([place]) 03/26/2017  . Spider veins of both lower extremities 11/28/2015  . Leukopenia 02/07/2015    Beth Potts Beth Potts Beth Potts PT, DPT 07/27/2020, 3:26 PM  [facility] [address] [place], [place], [address] Phone: [phone]   Fax:  [phone]  Name: Beth Potts MRN: [id number] Date of Birth: 20-Feb-1951

## 2020-08-03 ENCOUNTER — Ambulatory Visit (HOSPITAL_BASED_OUTPATIENT_CLINIC_OR_DEPARTMENT_OTHER): Payer: Medicare Other | Admitting: Physical Therapy

## 2020-08-05 ENCOUNTER — Other Ambulatory Visit: Payer: Self-pay

## 2020-08-05 ENCOUNTER — Ambulatory Visit (HOSPITAL_BASED_OUTPATIENT_CLINIC_OR_DEPARTMENT_OTHER): Payer: Medicare Other | Admitting: Physical Therapy

## 2020-08-05 DIAGNOSIS — M6281 Muscle weakness (generalized): Secondary | ICD-10-CM

## 2020-08-05 DIAGNOSIS — M545 Low back pain, unspecified: Secondary | ICD-10-CM

## 2020-08-05 DIAGNOSIS — M25651 Stiffness of right hip, not elsewhere classified: Secondary | ICD-10-CM

## 2020-08-05 DIAGNOSIS — M25652 Stiffness of left hip, not elsewhere classified: Secondary | ICD-10-CM

## 2020-08-05 NOTE — Therapy (Signed)
[facility] [address] [place], [place], [address] Phone: [phone]   Fax:  [phone]  Physical Therapy Treatment and Discharge  Patient Details  Name: Beth Potts MRN: [id number] Date of Birth: 12-01-1950 Referring Provider (PT): Koirala, Dibas, MD   PHYSICAL THERAPY DISCHARGE SUMMARY  Visits from Start of Care: 5  Current functional level related to goals / functional outcomes: See below   Remaining deficits: See below   Education / Equipment: See below  Plan: Patient agrees to discharge.  Patient goals were met. Patient is being discharged due to meeting the stated rehab goals.  ?????        Encounter Date: 08/05/2020   PT End of Session - 08/05/20 1253    Visit Number 5    Number of Visits 5    Date for PT Re-Evaluation 08/03/20    Authorization Type Medicare    PT Start Time 2229    PT Stop Time 1104    PT Time Calculation (min) 49 min    Activity Tolerance Patient tolerated treatment well    Behavior During Therapy WFL for tasks assessed/performed           Past Medical History:  Diagnosis Date  . Hyperlipidemia   . Hypertension   . Leukopenia 02/07/2015  . Thyroid disease   . Varicose veins     Past Surgical History:  Procedure Laterality Date  . JOINT REPLACEMENT Right    knee  . ROTATOR CUFF REPAIR Bilateral     There were no vitals filed for this visit.   Subjective Assessment - 08/05/20 1024    Subjective Pt states she's been doing well. Pt reports exercises have been alright.    Pertinent History HTN, asthma, hypothyroidism, hyperlipidemia, GERD    How long can you sit comfortably? n/a    How long can you stand comfortably? n/a    How long can you walk comfortably? n/a    Diagnostic tests Doppler: (-) bilat DVT    Patient Stated Goals Return to [facility]; safe return to the gym              [facility] PT Assessment - 08/05/20 0001      Single Leg Stance   Comments RLE:  21 sec; L LE: 23 sec;      AROM   Right Hip Extension 25    Right Hip External Rotation  35    Left Hip Extension 40    Left Hip External Rotation  35                  [facility] Adult PT Treatment/Exercise - 08/05/20 0001      Lumbar Exercises: Aerobic   Recumbent Bike Sci Fit x 5 min L 2      Lumbar Exercises: Supine   Other Supine Lumbar Exercises Self hip mobilization 3x10 sec for flex, ER, and ext bilat hip         10# kettlebell squat x10          PT Short Term Goals - 07/06/20 1736      PT SHORT TERM GOAL #1   Title STG = LTG             PT Long Term Goals - 08/05/20 1252      PT LONG TERM GOAL #1   Title Pt will be independent with final HEP for transition to gym/community wellness    Time 4    Period Weeks  Status Achieved      PT LONG TERM GOAL #2   Title Pt will be able to perform full squat with 25# to demo increased functional strength    Baseline Able to perform with 10# on 08/05/20    Time 4    Period Weeks    Status Partially Met      PT LONG TERM GOAL #3   Title Pt will demo improved bilat hip ROM to be [facility]    Baseline R hip extension limited to ~10 deg, ER limited ~30 deg; L hip ER limited ~30 deg    Time 4    Period Weeks    Status Achieved      PT LONG TERM GOAL #4   Title Pt will be able to perform SLS for at least 20 sec bilat to demo improved LE stability    Baseline 14 sec bilat; 21sec on R, 23 sec on L on3/18/22    Time 4    Period Weeks    Status Achieved                 Plan - 08/05/20 1253    Clinical Impression Statement Rechecked pt's goals -- pt has met or partially met all LTGs. Pt's L side remains weaker than R and she is not able to tolerate squating >10# at this time. Overall, pt with increased strength and ROM. Pt has not been complaining of any pain. Provided final HEP to demo how pt can perform self hip mobilizations to continue to improve her hip mobility. Pt ready for PT d/c.    Personal Factors  and Comorbidities Age    Comorbidities HTN, asthma, hypothyroidism, hyperlipidemia, GERD    Examination-Activity Limitations Squat;Locomotion Level    Examination-Participation Restrictions Occupation;Community Activity    Stability/Clinical Decision Making Stable/Uncomplicated    Rehab Potential Good    PT Frequency 1x / week    PT Duration 4 weeks    PT Treatment/Interventions ADLs/Self Care Home Management;Aquatic Therapy;Moist Heat;Gait training;Stair training;Functional mobility training;Therapeutic activities;Neuromuscular re-education;Balance training;Therapeutic exercise;Patient/family education;Manual techniques;Passive range of motion;Dry needling;Taping;Electrical Stimulation;Iontophoresis 70m/ml Dexamethasone    PT Next Visit Plan Assess response to HEP. R hip mobilizations, L hip muscle stretches. General hip and core strengthening -- show patient machines as indicated. Progress physioball exercises and side planking    PT Home Exercise Plan Access Code [email or web address]    Consulted and Agree with Plan of Care Patient           Patient will benefit from skilled therapeutic intervention in order to improve the following deficits and impairments:  Decreased mobility,Hypomobility,Decreased range of motion,Impaired flexibility,Decreased strength,Decreased balance  Visit Diagnosis: Midline low back pain without sciatica, unspecified chronicity  Stiffness of left hip, not elsewhere classified  Stiffness of right hip, not elsewhere classified  Muscle weakness (generalized)     Problem List Patient Active Problem List   Diagnosis Date Noted  . Pulmonary emboli (HDixon 03/26/2017  . Spider veins of both lower extremities 11/28/2015  . Leukopenia 02/07/2015    Beth Potts April Ma L Chantea Surace PT, DPT 08/05/2020, 12:59 PM  [facility] [address]Phone: [phone]  Fax:  [phone] Name: Beth MATHWIGMRN: [id number] of Birth: 4July 31, 1952

## 2020-08-17 DIAGNOSIS — F411 Generalized anxiety disorder: Secondary | ICD-10-CM | POA: Diagnosis not present

## 2020-08-17 DIAGNOSIS — M25562 Pain in left knee: Secondary | ICD-10-CM | POA: Diagnosis not present

## 2020-08-17 DIAGNOSIS — R200 Anesthesia of skin: Secondary | ICD-10-CM | POA: Diagnosis not present

## 2020-08-23 ENCOUNTER — Other Ambulatory Visit: Payer: Self-pay

## 2020-08-23 ENCOUNTER — Other Ambulatory Visit (HOSPITAL_COMMUNITY): Payer: Self-pay | Admitting: Physician Assistant

## 2020-08-23 ENCOUNTER — Ambulatory Visit (HOSPITAL_COMMUNITY)
Admission: RE | Admit: 2020-08-23 | Discharge: 2020-08-23 | Disposition: A | Discharge status: Home / Self Care | Payer: Medicare Other | Source: Ambulatory Visit | Attending: Physician Assistant | Admitting: Physician Assistant

## 2020-08-23 DIAGNOSIS — M79605 Pain in left leg: Secondary | ICD-10-CM

## 2020-08-23 DIAGNOSIS — M25562 Pain in left knee: Secondary | ICD-10-CM | POA: Diagnosis not present

## 2020-08-23 DIAGNOSIS — M1712 Unilateral primary osteoarthritis, left knee: Secondary | ICD-10-CM | POA: Diagnosis not present

## 2020-10-04 DIAGNOSIS — M1712 Unilateral primary osteoarthritis, left knee: Secondary | ICD-10-CM | POA: Diagnosis not present

## 2020-11-18 DIAGNOSIS — Z23 Encounter for immunization: Secondary | ICD-10-CM | POA: Diagnosis not present

## 2020-11-25 DIAGNOSIS — Z20822 Contact with and (suspected) exposure to covid-19: Secondary | ICD-10-CM | POA: Diagnosis not present

## 2020-12-05 DIAGNOSIS — M5459 Other low back pain: Secondary | ICD-10-CM | POA: Diagnosis not present

## 2020-12-05 DIAGNOSIS — M5416 Radiculopathy, lumbar region: Secondary | ICD-10-CM | POA: Diagnosis not present

## 2020-12-05 DIAGNOSIS — M545 Low back pain, unspecified: Secondary | ICD-10-CM | POA: Diagnosis not present

## 2021-01-16 DIAGNOSIS — M5416 Radiculopathy, lumbar region: Secondary | ICD-10-CM | POA: Diagnosis not present

## 2021-01-16 DIAGNOSIS — M5459 Other low back pain: Secondary | ICD-10-CM | POA: Diagnosis not present

## 2021-01-24 DIAGNOSIS — D649 Anemia, unspecified: Secondary | ICD-10-CM | POA: Diagnosis not present

## 2021-01-24 DIAGNOSIS — Z23 Encounter for immunization: Secondary | ICD-10-CM | POA: Diagnosis not present

## 2021-01-24 DIAGNOSIS — I700 Atherosclerosis of aorta: Secondary | ICD-10-CM | POA: Diagnosis not present

## 2021-01-24 DIAGNOSIS — R232 Flushing: Secondary | ICD-10-CM | POA: Diagnosis not present

## 2021-01-24 DIAGNOSIS — E039 Hypothyroidism, unspecified: Secondary | ICD-10-CM | POA: Diagnosis not present

## 2021-01-24 DIAGNOSIS — E785 Hyperlipidemia, unspecified: Secondary | ICD-10-CM | POA: Diagnosis not present

## 2021-01-24 DIAGNOSIS — I10 Essential (primary) hypertension: Secondary | ICD-10-CM | POA: Diagnosis not present

## 2021-01-24 DIAGNOSIS — K219 Gastro-esophageal reflux disease without esophagitis: Secondary | ICD-10-CM | POA: Diagnosis not present

## 2021-01-24 DIAGNOSIS — R7309 Other abnormal glucose: Secondary | ICD-10-CM | POA: Diagnosis not present

## 2021-01-24 DIAGNOSIS — E669 Obesity, unspecified: Secondary | ICD-10-CM | POA: Diagnosis not present

## 2021-01-30 DIAGNOSIS — M545 Low back pain, unspecified: Secondary | ICD-10-CM | POA: Diagnosis not present

## 2021-03-16 DIAGNOSIS — M5416 Radiculopathy, lumbar region: Secondary | ICD-10-CM | POA: Diagnosis not present

## 2021-03-28 DIAGNOSIS — M545 Low back pain, unspecified: Secondary | ICD-10-CM | POA: Diagnosis not present

## 2021-04-17 DIAGNOSIS — M79605 Pain in left leg: Secondary | ICD-10-CM | POA: Diagnosis not present

## 2021-04-17 DIAGNOSIS — R791 Abnormal coagulation profile: Secondary | ICD-10-CM | POA: Diagnosis not present

## 2021-04-17 DIAGNOSIS — M25552 Pain in left hip: Secondary | ICD-10-CM | POA: Diagnosis not present

## 2021-04-17 DIAGNOSIS — I10 Essential (primary) hypertension: Secondary | ICD-10-CM | POA: Diagnosis not present

## 2021-04-17 DIAGNOSIS — R200 Anesthesia of skin: Secondary | ICD-10-CM | POA: Diagnosis not present

## 2021-04-17 DIAGNOSIS — M5416 Radiculopathy, lumbar region: Secondary | ICD-10-CM | POA: Diagnosis not present

## 2021-04-17 DIAGNOSIS — Z86718 Personal history of other venous thrombosis and embolism: Secondary | ICD-10-CM | POA: Diagnosis not present

## 2021-04-17 DIAGNOSIS — R609 Edema, unspecified: Secondary | ICD-10-CM | POA: Diagnosis not present

## 2021-05-24 ENCOUNTER — Other Ambulatory Visit: Payer: Self-pay

## 2021-05-24 ENCOUNTER — Ambulatory Visit (INDEPENDENT_AMBULATORY_CARE_PROVIDER_SITE_OTHER): Payer: Medicare Other | Admitting: Podiatry

## 2021-05-24 DIAGNOSIS — R140 Abdominal distension (gaseous): Secondary | ICD-10-CM | POA: Insufficient documentation

## 2021-05-24 DIAGNOSIS — R1084 Generalized abdominal pain: Secondary | ICD-10-CM | POA: Insufficient documentation

## 2021-05-24 DIAGNOSIS — J309 Allergic rhinitis, unspecified: Secondary | ICD-10-CM | POA: Insufficient documentation

## 2021-05-24 DIAGNOSIS — L84 Corns and callosities: Secondary | ICD-10-CM

## 2021-05-24 DIAGNOSIS — L309 Dermatitis, unspecified: Secondary | ICD-10-CM | POA: Insufficient documentation

## 2021-05-24 DIAGNOSIS — E785 Hyperlipidemia, unspecified: Secondary | ICD-10-CM | POA: Insufficient documentation

## 2021-05-24 DIAGNOSIS — J45909 Unspecified asthma, uncomplicated: Secondary | ICD-10-CM | POA: Insufficient documentation

## 2021-05-24 DIAGNOSIS — E669 Obesity, unspecified: Secondary | ICD-10-CM | POA: Insufficient documentation

## 2021-05-24 DIAGNOSIS — R110 Nausea: Secondary | ICD-10-CM | POA: Insufficient documentation

## 2021-05-24 DIAGNOSIS — I872 Venous insufficiency (chronic) (peripheral): Secondary | ICD-10-CM | POA: Insufficient documentation

## 2021-05-24 DIAGNOSIS — Z86711 Personal history of pulmonary embolism: Secondary | ICD-10-CM | POA: Insufficient documentation

## 2021-05-24 DIAGNOSIS — K219 Gastro-esophageal reflux disease without esophagitis: Secondary | ICD-10-CM | POA: Insufficient documentation

## 2021-05-24 DIAGNOSIS — M5432 Sciatica, left side: Secondary | ICD-10-CM | POA: Insufficient documentation

## 2021-05-24 DIAGNOSIS — I700 Atherosclerosis of aorta: Secondary | ICD-10-CM | POA: Insufficient documentation

## 2021-05-24 DIAGNOSIS — F419 Anxiety disorder, unspecified: Secondary | ICD-10-CM | POA: Insufficient documentation

## 2021-05-24 NOTE — Progress Notes (Signed)
° °  Subjective: 71 y.o. female presenting to the office today as a new patient for evaluation of a symptomatic corn to the left fifth toe.  Patient states over the past 4 months has been very symptomatic and painful.  It began when she wears a pair of tight fitting Sunday shoes.  She has had pain and tenderness ever since.  She presents for further treatment and evaluation   Past Medical History:  Diagnosis Date   Hyperlipidemia    Hypertension    Leukopenia 02/07/2015   Thyroid disease    Varicose veins    Past Surgical History:  Procedure Laterality Date   JOINT REPLACEMENT Right    knee   ROTATOR CUFF REPAIR Bilateral    Allergies  Allergen Reactions   Dexlansoprazole Other (See Comments)   Doxycycline Other (See Comments)   Esomeprazole Magnesium Other (See Comments)     Objective:  Physical Exam General: Alert and oriented x3 in no acute distress  Dermatology: Hyperkeratotic lesion(s) present overlying the PIPJ of the left fifth toe. Pain on palpation with a central nucleated core noted. Skin is warm, dry and supple bilateral lower extremities. Negative for open lesions or macerations.  Vascular: Palpable pedal pulses bilaterally. No edema or erythema noted. Capillary refill within normal limits.  Neurological: Epicritic and protective threshold grossly intact bilaterally.   Musculoskeletal Exam: No pedal deformities noted  Assessment: 1.  Symptomatic corn overlying the PIPJ left fifth toe   Plan of Care:  1. Patient evaluated 2. Excisional debridement of keratoic lesion(s) using a chisel blade was performed without incident.  Patient felt immediate relief and alleviation of her symptoms 3.  Patient felt immediate relief.  Recommend wide fitting shoes that do not constrict the toebox area 4. Patient is to return to the clinic PRN.   Edrick Kins, DPM [facility]  Dr. Edrick Kins, DPM    [address], [place] [address]                Office [phone]  Fax [phone]

## 2021-06-28 DIAGNOSIS — Z20822 Contact with and (suspected) exposure to covid-19: Secondary | ICD-10-CM | POA: Diagnosis not present

## 2021-06-30 DIAGNOSIS — Z20822 Contact with and (suspected) exposure to covid-19: Secondary | ICD-10-CM | POA: Diagnosis not present

## 2021-07-10 DIAGNOSIS — Z23 Encounter for immunization: Secondary | ICD-10-CM | POA: Diagnosis not present

## 2021-08-31 DIAGNOSIS — Z20822 Contact with and (suspected) exposure to covid-19: Secondary | ICD-10-CM | POA: Diagnosis not present

## 2021-09-14 DIAGNOSIS — R062 Wheezing: Secondary | ICD-10-CM | POA: Diagnosis not present

## 2021-09-14 DIAGNOSIS — J029 Acute pharyngitis, unspecified: Secondary | ICD-10-CM | POA: Diagnosis not present

## 2021-09-14 DIAGNOSIS — R059 Cough, unspecified: Secondary | ICD-10-CM | POA: Diagnosis not present

## 2021-09-14 DIAGNOSIS — Z20828 Contact with and (suspected) exposure to other viral communicable diseases: Secondary | ICD-10-CM | POA: Diagnosis not present

## 2021-09-25 DIAGNOSIS — Z20822 Contact with and (suspected) exposure to covid-19: Secondary | ICD-10-CM | POA: Diagnosis not present

## 2021-09-27 DIAGNOSIS — J309 Allergic rhinitis, unspecified: Secondary | ICD-10-CM | POA: Diagnosis not present

## 2021-09-27 DIAGNOSIS — Z0000 Encounter for general adult medical examination without abnormal findings: Secondary | ICD-10-CM | POA: Diagnosis not present

## 2021-09-27 DIAGNOSIS — K219 Gastro-esophageal reflux disease without esophagitis: Secondary | ICD-10-CM | POA: Diagnosis not present

## 2021-09-27 DIAGNOSIS — Z86711 Personal history of pulmonary embolism: Secondary | ICD-10-CM | POA: Diagnosis not present

## 2021-09-27 DIAGNOSIS — R7309 Other abnormal glucose: Secondary | ICD-10-CM | POA: Diagnosis not present

## 2021-09-27 DIAGNOSIS — D649 Anemia, unspecified: Secondary | ICD-10-CM | POA: Diagnosis not present

## 2021-09-27 DIAGNOSIS — R232 Flushing: Secondary | ICD-10-CM | POA: Diagnosis not present

## 2021-09-27 DIAGNOSIS — E039 Hypothyroidism, unspecified: Secondary | ICD-10-CM | POA: Diagnosis not present

## 2021-09-27 DIAGNOSIS — L309 Dermatitis, unspecified: Secondary | ICD-10-CM | POA: Diagnosis not present

## 2021-09-27 DIAGNOSIS — I10 Essential (primary) hypertension: Secondary | ICD-10-CM | POA: Diagnosis not present

## 2021-09-27 DIAGNOSIS — E785 Hyperlipidemia, unspecified: Secondary | ICD-10-CM | POA: Diagnosis not present
